# Patient Record
Sex: Female | Born: 1980 | Race: White | Hispanic: No | Marital: Married | Smoking: Never smoker
Health system: Southern US, Community
[De-identification: ages and names within clinical notes are randomized; demographics above are authoritative.]

---

## 2005-08-12 ENCOUNTER — Ambulatory Visit: Payer: Self-pay | Admitting: Otolaryngology

## 2007-06-29 ENCOUNTER — Ambulatory Visit: Payer: Self-pay

## 2007-07-26 ENCOUNTER — Ambulatory Visit: Payer: Self-pay | Admitting: Unknown Physician Specialty

## 2007-08-21 ENCOUNTER — Ambulatory Visit: Payer: Self-pay | Admitting: Unknown Physician Specialty

## 2009-01-07 ENCOUNTER — Ambulatory Visit: Payer: Self-pay | Admitting: Otolaryngology

## 2018-11-09 ENCOUNTER — Emergency Department
Admission: EM | Admit: 2018-11-09 | Discharge: 2018-11-09 | Disposition: A | Discharge status: Other Acute Inpatient Hospital | Payer: 59 | Attending: Emergency Medicine | Admitting: Emergency Medicine

## 2018-11-09 ENCOUNTER — Encounter: Payer: Self-pay | Admitting: Emergency Medicine

## 2018-11-09 ENCOUNTER — Other Ambulatory Visit: Payer: Self-pay

## 2018-11-09 ENCOUNTER — Inpatient Hospital Stay
Admission: AD | Admit: 2018-11-09 | Discharge: 2018-11-30 | DRG: 885 | Disposition: A | Discharge status: Home / Self Care | Payer: 59 | Source: Intra-hospital | Attending: Psychiatry | Admitting: Psychiatry

## 2018-11-09 DIAGNOSIS — J302 Other seasonal allergic rhinitis: Secondary | ICD-10-CM | POA: Diagnosis present

## 2018-11-09 DIAGNOSIS — Z888 Allergy status to other drugs, medicaments and biological substances status: Secondary | ICD-10-CM

## 2018-11-09 DIAGNOSIS — F23 Brief psychotic disorder: Secondary | ICD-10-CM | POA: Diagnosis present

## 2018-11-09 DIAGNOSIS — F411 Generalized anxiety disorder: Secondary | ICD-10-CM | POA: Diagnosis present

## 2018-11-09 DIAGNOSIS — Z791 Long term (current) use of non-steroidal anti-inflammatories (NSAID): Secondary | ICD-10-CM

## 2018-11-09 DIAGNOSIS — F329 Major depressive disorder, single episode, unspecified: Secondary | ICD-10-CM

## 2018-11-09 DIAGNOSIS — F32A Depression, unspecified: Secondary | ICD-10-CM

## 2018-11-09 DIAGNOSIS — F323 Major depressive disorder, single episode, severe with psychotic features: Secondary | ICD-10-CM | POA: Diagnosis not present

## 2018-11-09 DIAGNOSIS — R45851 Suicidal ideations: Secondary | ICD-10-CM

## 2018-11-09 DIAGNOSIS — Z79899 Other long term (current) drug therapy: Secondary | ICD-10-CM

## 2018-11-09 DIAGNOSIS — T43595A Adverse effect of other antipsychotics and neuroleptics, initial encounter: Secondary | ICD-10-CM | POA: Diagnosis not present

## 2018-11-09 DIAGNOSIS — F22 Delusional disorders: Secondary | ICD-10-CM | POA: Diagnosis present

## 2018-11-09 LAB — URINE DRUG SCREEN, QUALITATIVE (ARMC ONLY)
Amphetamines, Ur Screen: NOT DETECTED
Barbiturates, Ur Screen: NOT DETECTED
Benzodiazepine, Ur Scrn: NOT DETECTED
COCAINE METABOLITE, UR ~~LOC~~: NOT DETECTED
Cannabinoid 50 Ng, Ur ~~LOC~~: NOT DETECTED
MDMA (Ecstasy)Ur Screen: NOT DETECTED
Methadone Scn, Ur: NOT DETECTED
OPIATE, UR SCREEN: NOT DETECTED
Phencyclidine (PCP) Ur S: NOT DETECTED
Tricyclic, Ur Screen: NOT DETECTED

## 2018-11-09 LAB — COMPREHENSIVE METABOLIC PANEL
ALT: 21 U/L (ref 0–44)
AST: 21 U/L (ref 15–41)
Albumin: 4.6 g/dL (ref 3.5–5.0)
Alkaline Phosphatase: 64 U/L (ref 38–126)
Anion gap: 12 (ref 5–15)
BUN: 10 mg/dL (ref 6–20)
CO2: 21 mmol/L — ABNORMAL LOW (ref 22–32)
Calcium: 9.3 mg/dL (ref 8.9–10.3)
Chloride: 102 mmol/L (ref 98–111)
Creatinine, Ser: 0.56 mg/dL (ref 0.44–1.00)
GFR calc Af Amer: >60 mL/min (ref >60)
GFR calc non Af Amer: >60 mL/min (ref >60)
Glucose, Bld: 151 mg/dL — ABNORMAL HIGH (ref 70–99)
Potassium: 3.5 mmol/L (ref 3.5–5.1)
Sodium: 135 mmol/L (ref 135–145)
Total Bilirubin: 0.6 mg/dL (ref 0.3–1.2)
Total Protein: 9 g/dL — ABNORMAL HIGH (ref 6.5–8.1)

## 2018-11-09 LAB — CBC
HEMATOCRIT: 43.3 % (ref 36.0–46.0)
Hemoglobin: 14.1 g/dL (ref 12.0–15.0)
MCH: 27.8 pg (ref 26.0–34.0)
MCHC: 32.6 g/dL (ref 30.0–36.0)
MCV: 85.2 fL (ref 80.0–100.0)
Platelets: 292 10*3/uL (ref 150–400)
RBC: 5.08 MIL/uL (ref 3.87–5.11)
RDW: 13.5 % (ref 11.5–15.5)
WBC: 8 10*3/uL (ref 4.0–10.5)
nRBC: 0 % (ref 0.0–0.2)

## 2018-11-09 LAB — SALICYLATE LEVEL: Salicylate Lvl: <7 mg/dL (ref 2.8–30.0)

## 2018-11-09 LAB — ACETAMINOPHEN LEVEL: Acetaminophen (Tylenol), Serum: <10 ug/mL — ABNORMAL LOW (ref 10–30)

## 2018-11-09 LAB — POCT PREGNANCY, URINE: Preg Test, Ur: NEGATIVE

## 2018-11-09 LAB — ETHANOL: Alcohol, Ethyl (B): <10 mg/dL (ref <10)

## 2018-11-09 MED ORDER — ALUM & MAG HYDROXIDE-SIMETH 200-200-20 MG/5ML PO SUSP: 30.0000 mL | ORAL | Status: DC | PRN

## 2018-11-09 MED ORDER — BREAST MILK/FORMULA (FOR LABEL PRINTING ONLY): ORAL | Status: DC

## 2018-11-09 MED ORDER — QUETIAPINE FUMARATE 100 MG PO TABS
100.0000 mg | ORAL_TABLET | Freq: Once | ORAL | Status: AC
  Administered 2018-11-09: 100 mg via ORAL
  Filled 2018-11-09: qty 1

## 2018-11-09 MED ORDER — MAGNESIUM HYDROXIDE 400 MG/5ML PO SUSP: 30.0000 mL | Freq: Every day | ORAL | Status: DC | PRN

## 2018-11-09 MED ORDER — LORATADINE 10 MG PO TABS
10.0000 mg | ORAL_TABLET | Freq: Every day | ORAL | Status: DC
  Administered 2018-11-09 – 2018-11-30 (×21): 10 mg via ORAL
  Filled 2018-11-09 (×23): qty 1

## 2018-11-09 MED ORDER — DILTIAZEM HCL 60 MG PO TABS
60.0000 mg | ORAL_TABLET | Freq: Two times a day (BID) | ORAL | Status: DC
  Administered 2018-11-09 – 2018-11-13 (×8): 60 mg via ORAL
  Filled 2018-11-09 (×8): qty 1

## 2018-11-09 MED ORDER — TRAZODONE HCL 50 MG PO TABS
50.0000 mg | ORAL_TABLET | Freq: Every day | ORAL | Status: DC
  Administered 2018-11-09 – 2018-11-29 (×20): 50 mg via ORAL
  Filled 2018-11-09 (×22): qty 1

## 2018-11-09 MED ORDER — HYDROXYZINE HCL 25 MG PO TABS
25.0000 mg | ORAL_TABLET | Freq: Four times a day (QID) | ORAL | Status: DC | PRN
  Administered 2018-11-09 – 2018-11-28 (×10): 25 mg via ORAL
  Filled 2018-11-09 (×10): qty 1

## 2018-11-09 MED ORDER — LORAZEPAM 2 MG PO TABS
2.0000 mg | ORAL_TABLET | ORAL | Status: DC | PRN
  Administered 2018-11-09 – 2018-11-17 (×13): 2 mg via ORAL
  Filled 2018-11-09 (×14): qty 1

## 2018-11-09 MED ORDER — SERTRALINE HCL 25 MG PO TABS
25.0000 mg | ORAL_TABLET | Freq: Every day | ORAL | Status: DC
  Administered 2018-11-09 – 2018-11-15 (×7): 25 mg via ORAL
  Filled 2018-11-09 (×7): qty 1

## 2018-11-09 MED ORDER — IBUPROFEN 200 MG PO TABS
400.0000 mg | ORAL_TABLET | Freq: Four times a day (QID) | ORAL | Status: DC | PRN
  Administered 2018-11-18: 400 mg via ORAL
  Filled 2018-11-09 (×2): qty 2

## 2018-11-09 MED ORDER — LORAZEPAM 2 MG PO TABS
2.0000 mg | ORAL_TABLET | Freq: Once | ORAL | Status: AC
  Administered 2018-11-09: 2 mg via ORAL
  Filled 2018-11-09: qty 1

## 2018-11-09 MED ORDER — ACETAMINOPHEN 325 MG PO TABS
650.0000 mg | ORAL_TABLET | Freq: Four times a day (QID) | ORAL | Status: DC | PRN
  Administered 2018-11-17 – 2018-11-29 (×3): 650 mg via ORAL
  Filled 2018-11-09 (×3): qty 2

## 2018-11-09 NOTE — ED Notes (Signed)
Patient visiting with her husband at this time.

## 2018-11-09 NOTE — ED Notes (Signed)
Pt having moments of confusion, asking if it's really march 19th, thinks it's march 17th. Repeating "I need sleep and I know I just need to stay calm."

## 2018-11-09 NOTE — ED Notes (Signed)
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

## 2018-11-09 NOTE — ED Notes (Signed)
Patient ran out of her room and yelled the rhapsody is coming, I need to see my husband and children now.

## 2018-11-09 NOTE — ED Notes (Signed)
Report to include Situation, Background, Assessment, and Recommendations received from [facility]. Patient alert and oriented, warm and dry, in no acute distress. Patient denied SI, HI, AVH and pain. Patient said she thought someone is going to kill her. She is under a lot of stress due to work. Patient is a Engineer, civil (consulting). Patient made aware of Q15 minute rounds and Psychologist, counselling presence for their safety. Patient instructed to come to me with needs or concerns.

## 2018-11-09 NOTE — ED Notes (Signed)
Patients husband Makesha Romanchuk called to inform us that patient does take essential oils but he is unsure of the combination of oils she uses and just wanted to see if the oils are playing a part in whats going on

## 2018-11-09 NOTE — ED Notes (Signed)
Patient is transferred to [facility] via NT and officer. Patient is stabel in NAD at the time of transfer. Patient belongings transferred with patient. Report given to [facility] RN. No issues

## 2018-11-09 NOTE — BH Assessment (Addendum)
Assessment Note  Ashley Griffin is an 38 y.o. female who presents to ED under IVC after her husband came home and found her acting bizarre. Pt reports "I'm afraid to go to sleep - I haven't slept since Monday. My husband called 911 because he thought I was going crazy and I thought I was going to hell (pt states she was told this by satan)." When this writer assessed for SI, pt had some thought-blocking behaviors and eventually she responded "no" when asked if she had been having thoughts to end her life. Pt also denied past thoughts and/or plan to end her life. However, when this writer asked pt's husband of past/current SI gestures and/or statements he reported "she told me she thought I'd be better off raising the kids by myself". Pt denied HI. Pt was extremely tearful while hyper-religious throughout assessment process. Pt was fearful of falling asleep, as she repeatedly told this writer "please don't let me fall asleep ... I can't fall asleep". Additional information was gathered from pt's husband due to pt's confusion and disorganized thought patterns.  Per pt's husband, pt is employed with [employer] as a Designer, jewellery. 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 He reports he believes this triggered pt's increased stress level. He also reports she is currently nursing her youngest child (61 months old) while also parenting an 73-year-old and 34-year-old. He further reports pt has not slept since Thursday (11/02/2018) and has had a decrease in her appetite. He was unable to recall pt exhibiting any manic behaviors (examples were given to husband) other than her inability to sleep. Yesterday (11/08/2018), he reports pt tried to fight him "because she was suspicious of who I was talking to". He also told this writer one night (this week) pt "woke up in the middle of the night and  she told me she saw people outside of our window walking in fire because they were going to hell".  Pt was recently seen by her primary care provider [facility]) last week where she was prescribed Zoloft and another medication the husband was unable to recall. However, pt never went to pick up Zoloft prescription per husband report. Pt does not have any history of past inpatient hospitalizations and/or outpatient mental health treatment.  Diagnosis: Major Depressive Disorder with psychotic features  Past Medical History: History reviewed. No pertinent past medical history.  History reviewed. No pertinent surgical history.  Family History: History reviewed. No pertinent family history.  Social History:  reports that she has never smoked. She has never used smokeless tobacco. She reports previous alcohol use. No history on file for drug.  Additional Social History:  Alcohol / Drug Use Pain Medications: See MAR Prescriptions: See MAR Over the Counter: See MAR History of alcohol / drug use?: No history of alcohol / drug abuse Longest period of sobriety (when/how long): (N/A) Negative Consequences of Use: (N/A) Withdrawal Symptoms: (N/A)  CIWA: CIWA-Ar BP: (!) 148/93 COWS:    Allergies: Not on File  Home Medications: (Not in a hospital admission)   OB/GYN Status:  Patient's last menstrual period was 11/09/2018 (exact date).  General Assessment Data Location of Assessment: [facility] ED TTS Assessment: In system Is this a Tele or Face-to-Face Assessment?: Face-to-Face Is this an Initial Assessment or a Re-assessment for this encounter?: Initial Assessment Patient Accompanied by:: N/A Language Other than English: No  Living Arrangements: Other (Comment)(Private Residence) What gender do you identify as?: Female Marital status: Married(16 years) [name] name: [facility] Pregnancy Status: No(Pt is currently nursing her youngest child) Living Arrangements: Spouse/significant other,  Children(3 children) Can pt return to current living arrangement?: Yes Admission Status: Involuntary Petitioner: Police Is patient capable of signing voluntary admission?: No Referral Source: Self/Family/Friend Insurance type: Scientist, water quality not listed; pt is employed - may be listed on [employer])  Medical Screening Exam ([facility] Walk-in ONLY) Medical Exam completed: Yes  Crisis Care Plan Living Arrangements: Spouse/significant other, Children(3 children) Legal Guardian: Other:(Self) Name of Psychiatrist: N/A Name of Therapist: N/A  Education Status Is patient currently in school?: No Is the patient employed, unemployed or receiving disability?: 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 : No Suicidal Intent: No Has patient had any suicidal intent within the past 6 months prior to admission? : No Is patient at risk for suicide?: No Suicidal Plan?: No Has patient had any suicidal plan within the past 6 months prior to admission? : No Access to Means: No What has been your use of drugs/alcohol within the last 12 months?: None Reported Previous Attempts/Gestures: No How many times?: 0 Other Self Harm Risks: None Reported Triggers for Past Attempts: None known Intentional Self Injurious Behavior: None Family Suicide History: No Recent stressful life event(s): Turmoil (Comment), Other (Comment)(Fear of COVID-19; increased workload; additional child home) Persecutory voices/beliefs?: Yes Depression: Yes Depression Symptoms: Tearfulness, Insomnia, Guilt Substance abuse history and/or treatment for substance abuse?: No Suicide prevention information given to non-admitted patients: Not applicable  Risk to Others within the past 6 months Homicidal Ideation: No Does patient have any lifetime risk of violence toward others beyond the six months prior  to admission? : No Thoughts of Harm to Others: No Current Homicidal Intent: No Current Homicidal Plan: No Access to Homicidal Means: No Identified Victim: N/A History of harm to others?: No Assessment of Violence: None Noted Violent Behavior Description: None Reported Does patient have access to weapons?: No Criminal Charges Pending?: No Does patient have a court date: No Is patient on probation?: No  Psychosis Hallucinations: Visual Delusions: Persecutory(Hyper-religious)  Mental Status Report Appearance/Hygiene: In scrubs Eye Contact: Fair Motor Activity: Freedom of movement Speech: Unable to assess(Pt was tearful making it difficult to understand) Level of Consciousness: Crying Mood: Fearful, Sad Affect: Frightened, Fearful, Labile Anxiety Level: Moderate Thought Processes: Thought Blocking Judgement: Unable to Assess Orientation: Person, Place Obsessive Compulsive Thoughts/Behaviors: None  Cognitive Functioning Concentration: Poor Memory: Unable to Assess Is patient IDD: No Insight: Poor Impulse Control: Poor Appetite: Poor Have you had any weight changes? : No Change Sleep: Decreased Total Hours of Sleep: 0 Vegetative Symptoms: Unable to Assess  ADLScreening [facility] Assessment Services) Patient's cognitive ability adequate to safely complete daily activities?: Yes Patient able to express need for assistance with ADLs?: Yes Independently performs ADLs?: Yes (appropriate for developmental age)  Prior Inpatient Therapy Prior Inpatient Therapy: No  Prior Outpatient Therapy Prior Outpatient Therapy: No Does patient have an ACCT team?: No Does patient have Intensive In-House Services?  : No Does patient have [facility] services? : No Does patient have P4CC services?: No  ADL Screening (condition at time of admission) Patient's cognitive ability adequate to safely complete daily activities?: Yes Patient able to express need for assistance with ADLs?:  Yes Independently performs ADLs?: Yes (appropriate for developmental  age)       Abuse/Neglect Assessment (Assessment to be complete while patient is alone) Abuse/Neglect Assessment Can Be Completed: Yes Physical Abuse: Denies Verbal Abuse: Denies Sexual Abuse: Denies Exploitation of patient/patient's resources: Denies Self-Neglect: Denies Values / Beliefs Cultural Requests During Hospitalization: None Spiritual Requests During Hospitalization: None Consults Spiritual Care Consult Needed: No Social Work Consult Needed: No Merchant navy officer (For Healthcare) Does Patient Have a Medical Advance Directive?: No       Child/Adolescent Assessment Running Away Risk: (Patient is an adult)  Disposition:  Disposition Initial Assessment Completed for this Encounter: Yes Disposition of Patient: Admit Type of inpatient treatment program: Adult Patient refused recommended treatment: No Mode of transportation if patient is discharged/movement?: N/A Patient referred to: Other (Comment)([facility])  On Site Evaluation by:   Reviewed with Physician:    Wilmon Arms 11/09/2018 3:23 PM

## 2018-11-09 NOTE — Tx Team (Signed)
Initial Treatment Plan 11/09/2018 9:06 PM Ashley Griffin BLT:[id number]    PATIENT STRESSORS: Financial difficulties Marital or family conflict Medication change or noncompliance Occupational concerns   PATIENT STRENGTHS: Average or above average intelligence Communication skills Motivation for treatment/growth Religious Affiliation   PATIENT IDENTIFIED PROBLEMS: Suicide Ideations    Severe Anxiety    Depression             DISCHARGE CRITERIA:  Ability to meet basic life and health needs Improved stabilization in mood, thinking, and/or behavior Motivation to continue treatment in a less acute level of care Safe-care adequate arrangements made  PRELIMINARY DISCHARGE PLAN: Attend PHP/IOP Outpatient therapy Participate in family therapy Return to previous living arrangement  PATIENT/FAMILY INVOLVEMENT: This treatment plan has been presented to and reviewed with the patient, Ashley Griffin,   The patient  have been given the opportunity to ask questions and make suggestions.  Lelan Pons, RN 11/09/2018, 9:06 PM

## 2018-11-09 NOTE — ED Provider Notes (Signed)
[facility] Emergency Department Provider Note   ____________________________________________   First MD Initiated Contact with Patient 11/09/18 1218     (approximate)  I have reviewed the triage vital signs and the nursing notes.   HISTORY  Chief Complaint Paranoid    HPI Ashley Griffin is a 38 y.o. female patient apparently had an episode of feeling suicidal and thinking that her children would be better off without her.  She has also had episodes where she feels confused and is not sure that things are real.  She told me that she still having some of these episodes now when I saw her.  She is not having any numbness or weakness.  She does appear to be quite anxious.         History reviewed. No pertinent past medical history.  Patient Active Problem List   Diagnosis Date Noted  . 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 No pertinent surgical history.  Prior to Admission medications   Medication Sig Start Date End Date Taking? Authorizing Provider  diltiazem (CARDIZEM) 60 MG tablet Take 60 mg by mouth every 12 (twelve) hours. 05/10/17  Yes [provider]  fexofenadine (ALLEGRA) 180 MG tablet Take 180 mg by mouth as needed. 10/21/10  Yes [provider]  hydrOXYzine (ATARAX/VISTARIL) 25 MG tablet Take 25 mg by mouth every 6 (six) hours as needed for sleep. 11/06/18  Yes [provider]  ibuprofen (ADVIL,MOTRIN) 200 MG tablet Take 600 mg by mouth every 6 (six) hours as needed for pain. 05/04/17  Yes [provider]  sertraline (ZOLOFT) 50 MG tablet Take 25 mg by mouth daily. 11/08/18 11/08/19 Yes [provider]  traZODone (DESYREL) 50 MG tablet Take 50-100 mg by mouth at bedtime. 11/08/18 12/08/18 Yes [provider]    Allergies Patient has no allergy information on record.  History reviewed. No pertinent family history.  Social History Social History   Tobacco Use  .  Smoking status: Never Smoker  . Smokeless tobacco: Never Used  Substance Use Topics  . Alcohol use: Not Currently    Frequency: Never  . Drug use: Not on file    Review of Systems  Constitutional: No fever/chills Eyes: No visual changes. ENT: No sore throat. Cardiovascular: Denies chest pain. Respiratory: Denies shortness of breath. Gastrointestinal: No abdominal pain.  No nausea, no vomiting.  No diarrhea.  No constipation. Genitourinary: Negative for dysuria. Musculoskeletal: Negative for back pain. Skin: Negative for rash. Neurological: Negative for headaches, focal weakness   ____________________________________________   PHYSICAL EXAM:  VITAL SIGNS: ED Triage Vitals  Enc Vitals Group     BP 11/09/18 1125 (!) 148/93     Pulse --      Resp 11/09/18 1125 16     Temp 11/09/18 1125 98.3 F (36.8 C)     Temp Source 11/09/18 1125 Oral     SpO2 11/09/18 1125 100 %     Weight 11/09/18 1128 150 lb (68 kg)     Height 11/09/18 1128 5\' 3"  (1.6 m)     Head Circumference --      Peak Flow --      Pain Score --      Pain Loc --      Pain Edu? --      Excl. in GC? --     Constitutional: Alert and oriented. Well appearing but anxious Eyes: Conjunctivae are normal.  Head: Atraumatic Nose: No congestion/rhinnorhea. Mouth/Throat: Mucous membranes  are moist.  Oropharynx non-erythematous. Neck: No stridor.  Cardiovascular: Normal rate, regular rhythm. Grossly normal heart sounds.  Good peripheral circulation. Respiratory: Normal respiratory effort.  No retractions. Lungs CTAB. Gastrointestinal: Soft and nontender. No distention. No abdominal bruits.  Musculoskeletal: No lower extremity tenderness nor edema.  Neurologic:  Normal sp Skin:  Skin is warm, dry and intact. No rash noted. . ____________________________________________   LABS (all labs ordered are listed, but only abnormal results are displayed)  Labs Reviewed  COMPREHENSIVE METABOLIC PANEL - Abnormal; Notable  for the following components:      Result Value   CO2 21 (*)    Glucose, Bld 151 (*)    Total Protein 9.0 (*)    All other components within normal limits  ACETAMINOPHEN LEVEL - Abnormal; Notable for the following components:   Acetaminophen (Tylenol), Serum <10 (*)    All other components within normal limits  ETHANOL  SALICYLATE LEVEL  CBC  URINE DRUG SCREEN, QUALITATIVE ([facility] ONLY)  POC URINE PREG, ED  POCT PREGNANCY, URINE   ____________________________________________  EKG   ____________________________________________  RADIOLOGY  ED MD interpretation:    Official radiology report(s): No results found.  ____________________________________________   PROCEDURES  Procedure(s) performed (including Critical Care):  Procedures   ____________________________________________   INITIAL IMPRESSION / ASSESSMENT AND PLAN / ED COURSE  Patient seen by psychiatry NP.  She recommends inpatient treatment I fully agree.  This patient has several psych problems going on.  I will diagnose her with suicidal ideation for now.              ____________________________________________   FINAL CLINICAL IMPRESSION(S) / ED DIAGNOSES  Final diagnoses:  Suicidal ideation     ED Discharge Orders    None       Note:  This document was prepared using Dragon voice recognition software and may include unintentional dictation errors.    Arnaldo Natal, MD 11/09/18 [phone]

## 2018-11-09 NOTE — Consult Note (Signed)
[facility] Face-to-Face Psychiatry Consult   Reason for Consult:  Psychosis Referring Physician:  Dr. Darnelle Catalan Patient Identification: Ashley Griffin MRN:  [id number] Principal Diagnosis: <principal problem not specified> Diagnosis:  Major Depression with psychotic features  Total Time spent with patient: 1 hour  Subjective:   Ashley Griffin is a 38 y.o. female patient presented to [facility] ED via law enforcement under involuntary commitment status (IVC) . The patient was seen face-to-face by this provider; chart reviewed and consulted with Dr.Melinda on 11/09/2018 due to the care of the patient. It was discussed that the patient does meet criteria to be admitted to the inpatient unit. Due to the patient presenting with psychotic symptoms along with auditory and visual hallucinations.  Stated to her husband "you will be better off raising the children by yourself." She was given 2 mg of Ativan by mouth.  She has been resting quietly and does not appear to be responding to internal or external stimuli currently.  Prior to accepting the Ativan she was presenting with delusional thinking. It was reported by her husband that she was presenting suicidal, homicidal, and self-harm ideations. The patient is not presenting with any psychotic or paranoid behaviors at this monent. During an encounter with the patient, she was able to answer some questions appropriately.  Collateral was obtained by the patient's husband who expresses concerns for the patient psychotic behaviors. " I have never seen her like this."  Per TTS Counselor Ms. Elisabeth Most, Per pt's husband, pt is employed with [employer] as a Designer, jewellery. 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 He reports he believes this triggered pt's increased stress level. He also reports she is currently nursing her youngest child (46 months  old) while also parenting an 85-year-old and 83-year-old. He further reports pt has not slept since Thursday (11/02/2018) and has had a decrease in her appetite. He was unable to recall pt exhibiting any manic behaviors (examples were given to husband) other than her inability to sleep. Yesterday (11/08/2018), he reports pt tried to fight him "because she was suspicious of who I was talking to". He also told this writer one night (this week) pt "woke up in the middle of the night and she told me she saw people outside of our window walking in fire because they were going to hell". Pt was recently seen by her primary care provider [facility]) last week where she was prescribed Zoloft and another medication the husband was unable to recall. However, pt never went to pick up Zoloft prescription per husband report. Pt does not have any history of past inpatient hospitalizations and/or outpatient mental health treatment. Per patient husband she has never had any psychiatric hospitalization. He did disclosed that she suffered from postpartum depression, but never sought treatment for it. "She will never take any type of medication. She believes in all natural stuff." Plan: The patient is a safety risk to herself and her children, She requires inpatient stabilization and treatment.  HPI: Per Dr. Juliette Alcide; HPI Ashley Griffin is a 38 y.o. female patient apparently had an episode of feeling suicidal and thinking that her children would be better off without her.  She has also had episodes where she feels confused and is not sure that things are real.  She told me that she still having some of these episodes now when I saw her.  She is not having any numbness or weakness.  She does appear to be quite anxious.      Past Psychiatric History:  Postpartum depression  Risk to Self: Suicidal Ideation: No Suicidal Intent: No Is patient at risk for suicide?: No Suicidal Plan?: No Access to Means: No What has been  your use of drugs/alcohol within the last 12 months?: None Reported How many times?: (P) 0 Other Self Harm Risks: (P) None Reported Triggers for Past Attempts: (P) None known Intentional Self Injurious Behavior: (P) None Risk to Others: Homicidal Ideation: (P) No Thoughts of Harm to Others: (P) No Current Homicidal Intent: (P) No Current Homicidal Plan: (P) No Access to Homicidal Means: (P) No Identified Victim: (P) N/A History of harm to others?: (P) No Assessment of Violence: (P) None Noted Violent Behavior Description: (P) None Reported Does patient have access to weapons?: (P) No Criminal Charges Pending?: (P) No Does patient have a court date: (P) No Prior Inpatient Therapy:  No Prior Outpatient Therapy:  No  Past Medical History: History reviewed. No pertinent past medical history. History reviewed. No pertinent surgical history. Family History: History reviewed. No pertinent family history. Family Psychiatric  History: History reviewed. No pertinent family history Social History:  History reviewed. No pertinent family history Social History   Substance and Sexual Activity  Alcohol Use Not Currently  . Frequency: Never     Social History   Substance and Sexual Activity  Drug Use Not on file    Social History   Socioeconomic History  . Marital status: Married    Spouse name: Not on file  . Number of children: Not on file  . Years of education: Not on file  . Highest education level: Not on file  Occupational History  . Not on file  Social Needs  . Financial resource strain: Not on file  . Food insecurity:    Worry: Not on file    Inability: Not on file  . Transportation needs:    Medical: Not on file    Non-medical: Not on file  Tobacco Use  . Smoking status: Never Smoker  . Smokeless tobacco: Never Used  Substance and Sexual Activity  . Alcohol use: Not Currently    Frequency: Never  . Drug use: Not on file  . Sexual activity: Not on file  Lifestyle   . Physical activity:    Days per week: Not on file    Minutes per session: Not on file  . Stress: Not on file  Relationships  . Social connections:    Talks on phone: Not on file    Gets together: Not on file    Attends religious service: Not on file    Active member of club or organization: Not on file    Attends meetings of clubs or organizations: Not on file    Relationship status: Not on file  Other Topics Concern  . Not on file  Social History Narrative  . Not on file   Additional Social History:    Allergies:  Not on File  Labs:  Results for orders placed or performed during the hospital encounter of 11/09/18 (from the past 48 hour(s))  Comprehensive metabolic panel     Status: Abnormal   Collection Time: 11/09/18 11:41 AM  Result Value Ref Range   Sodium 135 135 - 145 mmol/L   Potassium 3.5 3.5 - 5.1 mmol/L   Chloride 102 98 - 111 mmol/L   CO2 21 (L) 22 - 32  mmol/L   Glucose, Bld 151 (H) 70 - 99 mg/dL   BUN 10 6 - 20 mg/dL   Creatinine, Ser 1.610.56 0.44 - 1.00 mg/dL   Calcium 9.3 8.9 - 09.610.3 mg/dL   Total Protein 9.0 (H) 6.5 - 8.1 g/dL   Albumin 4.6 3.5 - 5.0 g/dL   AST 21 15 - 41 U/L   ALT 21 0 - 44 U/L   Alkaline Phosphatase 64 38 - 126 U/L   Total Bilirubin 0.6 0.3 - 1.2 mg/dL   GFR calc non Af Amer >60 >60 mL/min   GFR calc Af Amer >60 >60 mL/min   Anion gap 12 5 - 15    Comment: Performed at [facility], [address] [place], [place] [address]  Ethanol     Status: None   Collection Time: 11/09/18 11:41 AM  Result Value Ref Range   Alcohol, Ethyl (B) <10 <10 mg/dL    Comment: (NOTE) Lowest detectable limit for serum alcohol is 10 mg/dL. For medical purposes only. Performed at [facility], [address] [place], [place] [address]   Salicylate level     Status: None   Collection Time: 11/09/18 11:41 AM  Result Value Ref Range   Salicylate Lvl <7.0 2.8 - 30.0 mg/dL    Comment: Performed at [facility], [address] [place], [place] [address]  Acetaminophen level     Status: Abnormal   Collection Time: 11/09/18 11:41 AM  Result Value Ref Range   Acetaminophen (Tylenol), Serum <10 (L) 10 - 30 ug/mL    Comment: (NOTE) Therapeutic concentrations vary significantly. A range of 10-30 ug/mL  may be an effective concentration for many patients. However, some  are best treated at concentrations outside of this range. Acetaminophen concentrations >150 ug/mL at 4 hours after ingestion  and >50 ug/mL at 12 hours after ingestion are often associated with  toxic reactions. Performed at [facility], [address] [place], [place] [address]   cbc     Status: None   Collection Time: 11/09/18 11:41 AM  Result Value Ref Range   WBC 8.0 4.0 - 10.5 K/uL   RBC 5.08 3.87 - 5.11 MIL/uL   Hemoglobin 14.1 12.0 - 15.0 g/dL   HCT 30.843.3 65.736.0 - 84.646.0 %   MCV 85.2 80.0 - 100.0 fL   MCH 27.8 26.0 - 34.0 pg   MCHC 32.6 30.0 - 36.0 g/dL   RDW 96.213.5 95.211.5 - 84.115.5 %   Platelets 292 150 - 400 K/uL   nRBC 0.0 0.0 - 0.2 %    Comment: Performed at [facility], [address] [place], [place] [address]  Urine Drug Screen, Qualitative     Status: None   Collection Time: 11/09/18  1:18 PM  Result Value Ref Range   Tricyclic, Ur Screen NONE DETECTED NONE DETECTED   Amphetamines, Ur Screen NONE DETECTED NONE DETECTED   MDMA (Ecstasy)Ur Screen NONE DETECTED NONE DETECTED   Cocaine Metabolite,Ur [place] NONE DETECTED NONE DETECTED   Opiate, Ur Screen NONE DETECTED NONE DETECTED   Phencyclidine (PCP) Ur S NONE DETECTED NONE DETECTED   Cannabinoid 50 Ng, Ur [place] NONE DETECTED NONE DETECTED   Barbiturates, Ur Screen NONE DETECTED NONE DETECTED   Benzodiazepine, Ur Scrn NONE DETECTED NONE DETECTED   Methadone Scn, Ur NONE DETECTED NONE DETECTED    Comment: (NOTE) Tricyclics + metabolites, urine    Cutoff 1000 ng/mL Amphetamines + metabolites, urine  Cutoff 1000 ng/mL MDMA (Ecstasy),  urine              Cutoff 500  ng/mL Cocaine Metabolite, urine          Cutoff 300 ng/mL Opiate + metabolites, urine        Cutoff 300 ng/mL Phencyclidine (PCP), urine         Cutoff 25 ng/mL Cannabinoid, urine                 Cutoff 50 ng/mL Barbiturates + metabolites, urine  Cutoff 200 ng/mL Benzodiazepine, urine              Cutoff 200 ng/mL Methadone, urine                   Cutoff 300 ng/mL The urine drug screen provides only a preliminary, unconfirmed analytical test result and should not be used for non-medical purposes. Clinical consideration and professional judgment should be applied to any positive drug screen result due to possible interfering substances. A more specific alternate chemical method must be used in order to obtain a confirmed analytical result. Gas chromatography / mass spectrometry (GC/MS) is the preferred confirmat ory method. Performed at [facility], [address] [place], [place] [address]   Pregnancy, urine POC     Status: None   Collection Time: 11/09/18  1:31 PM  Result Value Ref Range   Preg Test, Ur NEGATIVE NEGATIVE    Comment:        THE SENSITIVITY OF THIS METHODOLOGY IS >24 mIU/mL     No current facility-administered medications for this encounter.    Current Outpatient Medications  Medication Sig Dispense Refill  . diltiazem (CARDIZEM) 60 MG tablet Take 60 mg by mouth every 12 (twelve) hours.    . fexofenadine (ALLEGRA) 180 MG tablet Take 180 mg by mouth as needed.    . hydrOXYzine (ATARAX/VISTARIL) 25 MG tablet Take 25 mg by mouth every 6 (six) hours as needed for sleep.    Marland Kitchen ibuprofen (ADVIL,MOTRIN) 200 MG tablet Take 600 mg by mouth every 6 (six) hours as needed for pain.    Marland Kitchen sertraline (ZOLOFT) 50 MG tablet Take 25 mg by mouth daily.    . traZODone (DESYREL) 50 MG tablet Take 50-100 mg by mouth at bedtime.      Musculoskeletal: Strength & Muscle Tone: within normal limits Gait & Station: normal Patient leans: N/A  Psychiatric Specialty  Exam: Physical Exam  Nursing note and vitals reviewed. Constitutional: She appears well-developed and well-nourished.  HENT:  Head: Normocephalic and atraumatic.  Right Ear: External ear normal.  Left Ear: External ear normal.  Eyes: Pupils are equal, round, and reactive to light. Conjunctivae and EOM are normal.  Neck: Normal range of motion. Neck supple.  Cardiovascular: Normal rate and regular rhythm.  Respiratory: Breath sounds normal.  Musculoskeletal: Normal range of motion.  Neurological: She is alert.  Skin: Skin is warm and dry.    Review of Systems  Constitutional: Negative.   HENT: Negative.   Eyes: Negative.   Respiratory: Negative.   Cardiovascular: Negative.   Gastrointestinal: Negative.   Genitourinary: Negative.   Musculoskeletal: Negative.   Neurological: Negative.   Endo/Heme/Allergies: Negative.   Psychiatric/Behavioral: Positive for depression, hallucinations and suicidal ideas. Negative for memory loss and substance abuse. The patient is nervous/anxious and has insomnia.     Blood pressure (!) 148/93, temperature 98.3 F (36.8 C), temperature source Oral, resp. rate 16, height 5\' 3"  (1.6 m), weight 68 kg, last menstrual period  11/09/2018, SpO2 100 %.Body mass index is 26.57 kg/m.  General Appearance: Fairly Groomed  Eye Contact:  Minimal  Speech:  Pressured  Volume:  Increased  Mood:  Anxious, Depressed and Hopeless  Affect:  Depressed, Flat and Tearful  Thought Process:  Disorganized  Orientation:  Full (Time, Place, and Person)  Thought Content:  Delusions, Hallucinations: Auditory Visual and Ideas of Reference:   Paranoia  Suicidal Thoughts:  No  Homicidal Thoughts:  No  Memory:  Recent;   Good  Judgement:  Poor  Insight:  Lacking  Psychomotor Activity:  Normal  Concentration:  Concentration: Poor  Recall:  Fair  Fund of Knowledge:  Fair  Language:  Good  Akathisia:  Negative  Handed:  Right  AIMS (if indicated):     Assets:  Desire for  Improvement Social Support  ADL's:  Intact  Cognition:  Impaired,  Moderate  Sleep:   Insomnia     Treatment Plan Summary: Daily contact with patient to assess and evaluate symptoms and progress in treatment and Medication management  Disposition: Supportive therapy provided about ongoing stressors.  Patient does meet criteria for psychiatric inpatient admission  Catalina Gravel, NP 11/09/2018 2:45 PM

## 2018-11-09 NOTE — ED Notes (Signed)
Black flip flops, Pink shirt, black bra, black pants, gray underwear placed in patient belongings bag.

## 2018-11-09 NOTE — ED Notes (Signed)
Patient visiting with husband, patient is tearful and still continues to be confused asking what time is it and date, constantly. Patient continues to be drowsy from the Ativan, Clinical research associate talked with patient and husband and explained to them that patient will be admitted inpatient, patient and husband became tearful.   Patients husband discussed with Clinical research associate that she is on her menstrual cycle, Clinical research associate placed sanitary napkins in patients room and explained what they were.

## 2018-11-09 NOTE — ED Notes (Signed)
Patient visiting with husband. 

## 2018-11-09 NOTE — ED Triage Notes (Signed)
Pt brought in by [place] Sheriff dept. Husband came home from work, because she "needed him to come home right away," husband had to restrain her once he got there-pt was stating she wanted to kill herself. Husband called police, IVC papers present. Pt is a Engineer, civil (consulting), has been overwhelmed at work and at home. States "I want to go to sleep and never wake up." Calm and appropriate in triage.

## 2018-11-09 NOTE — Progress Notes (Signed)
Patient is a new admit from ED IVC-ed  By her husband for feeling suicidal and thinking that her children will do better with out her, patient states that she is confused , depressed with severe anxiety due to COVID19, hyper religious that GOD is going to kill every body. Patient is tearful and hopeless.lack concentration, mood swings and sadness  Refused to contract for safety stating that she can't think straight . Unit safety guidelines  and procedure is explained to patient , cold sandwich and beverages are provided. Hygiene products are  provided , patient is a lactating mother. Skin and body search is by two nurses , no contraband found and skin is clean, room orientation is complete, room is within  eyesight from the nurses station, patient states I"m not feeling suicidal but I need medicine for anxiety, medication is given per dr. Jarrett Ables. See MAR.

## 2018-11-09 NOTE — ED Notes (Signed)
Patient assigned to appropriate care area   Introduced self to pt  Patient oriented to unit/care area: Informed that, for their safety, care areas are designed for safety and visiting and phone hours explained to patient. Patient verbalizes understanding, and verbal contract for safety obtained  Environment secured    Patient very tearful and stating she feels very confused, she says I know where I am, but I am just making sure all of this is real, me being here at this hospital.

## 2018-11-09 NOTE — BH Assessment (Signed)
Patient is to be admitted to [facility] by Gillermo Murdoch, NP.  Attending Physician will be Dr. Toni Amend.   Patient has been assigned to room 324, by [facility] Charge Nurse S[place].   Intake Paper Work has been signed and placed on patient chart.  ER staff is aware of the admission:  Misty Stanley, ER Secretary    Dr. Darnelle Catalan, ER MD   Geralynn Ochs, Patient's Nurse   Dedra Skeens, Patient Access.

## 2018-11-09 NOTE — ED Triage Notes (Signed)
Denies si/hi at this time, crying off and on and stating "I just want to be a good mom and wife, am I doing the right thing?" Easily calms.

## 2018-11-10 DIAGNOSIS — F32A Depression, unspecified: Secondary | ICD-10-CM

## 2018-11-10 DIAGNOSIS — F411 Generalized anxiety disorder: Secondary | ICD-10-CM

## 2018-11-10 DIAGNOSIS — F23 Brief psychotic disorder: Secondary | ICD-10-CM

## 2018-11-10 DIAGNOSIS — F329 Major depressive disorder, single episode, unspecified: Secondary | ICD-10-CM

## 2018-11-10 MED ORDER — ENSURE ENLIVE PO LIQD
237.0000 mL | Freq: Two times a day (BID) | ORAL | Status: DC
  Administered 2018-11-10 – 2018-11-30 (×38): 237 mL via ORAL

## 2018-11-10 MED ORDER — ADULT MULTIVITAMIN W/MINERALS CH
1.0000 | ORAL_TABLET | Freq: Every day | ORAL | Status: DC
  Administered 2018-11-10 – 2018-11-30 (×21): 1 via ORAL
  Filled 2018-11-10 (×21): qty 1

## 2018-11-10 MED ORDER — QUETIAPINE FUMARATE 100 MG PO TABS: 100.0000 mg | ORAL_TABLET | Freq: Every evening | ORAL | Status: DC | PRN

## 2018-11-10 NOTE — Progress Notes (Signed)
Recreation Therapy Notes  Date: 11/10/2018  Time: 9:30 am   Location: Craft room   Behavioral response: N/A   Intervention Topic: Communication  Discussion/Intervention: Patient did not attend group.   Clinical Observations/Feedback:  Patient did not attend group.   Kristoff Coonradt LRT/CTRS        Ashley Griffin 11/10/2018 10:28 AM 

## 2018-11-10 NOTE — Progress Notes (Signed)
Patient with the direct supervision of female staff BHT was given the opportunity to breast pump. Patient able to breast pump in her room with no complications.

## 2018-11-10 NOTE — BHH Counselor (Signed)
Adult Comprehensive Assessment  Patient ID: Ashley Griffin, female   DOB: 06/05/1981, 38 y.o.   MRN: [id number]  Information Source: Information source: Patient  Current Stressors:  Patient states their primary concerns and needs for treatment are:: Pt reports "thought that I was having a psychootic break. I hadn't slept in 5 days.  I thought someone was trying to kill me." Patient states their goals for this hospitilization and ongoing recovery are:: Pt rreports "just to make sure that I am not a zombie.  And get some sleep.  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  An older lady called recently and was afrais to get treatment because of Covid.  It just broke my heart." Bereavement / Loss: Pt reports "not one that is really close".   Living/Environment/Situation:  Living Arrangements: Spouse/significant other Living conditions (as described by patient or guardian): Pt reports "good". Who else lives in the home?: Pt lives with husband and three children. How long has patient lived in current situation?: Pt reports "a couple of years".  What is atmosphere in current home: Comfortable, Loving, Supportive  Family History:  Marital status: Married Number of Years Married: 17 What types of issues is patient dealing with in the relationship?: Pt reports "he's been really busy and I thought that I could be better.  When all this started I needed a lot of support." Are you sexually active?: Yes What is your sexual orientation?: Heterosexual Does patient have children?: Yes How many children?: 3 How is patient's relationship with their children?: Pt has a 106, 4 and 50 month old.  Pt reports relationship is "good".   Childhood History:  By whom was/is the patient raised?: Both parents Description of patient's relationship with caregiver when they were a child: Pt reports "they were good and loving parents". Patient's description of current  relationship with people who raised him/her: Pt reports "it's good". How were you disciplined when you got in trouble as a child/adolescent?: Pt reports "I was spanked if I did something wrong".  Does patient have siblings?: Yes Number of Siblings: 1 Description of patient's current relationship with siblings: Pt reports "good but we're not all that close".  Did patient suffer any verbal/emotional/physical/sexual abuse as a child?: No Did patient suffer from severe childhood neglect?: No Has patient ever been sexually abused/assaulted/raped as an adolescent or adult?: No Was the patient ever a victim of a crime or a disaster?: No Witnessed domestic violence?: No Has patient been effected by domestic violence as an adult?: No  Education:  Highest grade of school patient has completed: Chief Operating Officer Currently a Consulting civil engineer?: No Learning disability?: No  Employment/Work Situation:   Employment situation: Employed Where is patient currently employed?: Pt works for [employer] as a Charity fundraiser. How long has patient been employed?: Pt reports 2-3 years. Patient's job has been impacted by current illness: Yes Describe how patient's job has been impacted: Pt rpeorts anxiety from the number of calls that she receives regarding Covid-19. What is the longest time patient has a held a job?: 10 years Where was the patient employed at that time?: Pt reports that she worked for [employer] system.   Did You Receive Any Psychiatric Treatment/Services While in the Military?: No(NA) Are There Guns or Other Weapons in Your Home?: Yes Types of Guns/Weapons: Guns Are These Weapons Safely Secured?: Yes  Financial Resources:   Financial resources: Income from employment, Private insurance Does patient have  a representative payee or guardian?: No  Alcohol/Substance Abuse:   What has been your use of drugs/alcohol within the last 12 months?: Pt denies. If attempted suicide, did drugs/alcohol play a  role in this?: No(Pt denies past suicidal attempts.) Alcohol/Substance Abuse Treatment Hx: Denies past history Has alcohol/substance abuse ever caused legal problems?: No  Social Support System:   Patient's Community Support System: Good Describe Community Support System: Pt reports "several friends and family".  Type of faith/religion: Pt reports "Christian".  How does patient's faith help to cope with current illness?: Pt reports "I've been reading my Bibleand praying".   Leisure/Recreation:   Leisure and Hobbies: Pt reports "I don't really have any right now.  If I did it would be exercising, reading adn spending more time with my kids."  Strengths/Needs:   What is the patient's perception of their strengths?: Pt reports "I think I do everything well that is probably why I am here." Patient states these barriers may affect/interfere with their treatment: Pt denies barriers ar this time.  Patient states these barriers may affect their return to the community: Pt denies barriers ar this time.  Other important information patient would like considered in planning for their treatment: Pt reports that she struggles with balance and self-care.   Discharge Plan:   Currently receiving community mental health services: No Patient states concerns and preferences for aftercare planning are: Pt reports that she is open to a therapist, however, would like a female faith based counselor. Pt reports that she is also interested in having her pastor becoming her counselor. Patient states they will know when they are safe and ready for discharge when: Pt reports "when I'm not cloudy and when I'm calm." Does patient have access to transportation?: Yes Does patient have financial barriers related to discharge medications?: Yes Patient description of barriers related to discharge medications: Pt has [employer] Will patient be returning to same living situation after discharge?: Yes  Summary/Recommendations:    Summary and Recommendations (to be completed by the evaluator): Patient is a 38 year old married female from [place], [place] [facility]).  Patient reports that she has [employer]. 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  She presents to the hospital as an involuntary commitment due to concerns about the patient's reported lack of sleep, anxiety, concerns of Covid-19 and passive suicidal statements.  She has a Major Depressive Disorder, single episode, severe with psychosis.  Recommendations include crisis stabilization, therapeutic milieu, encourage group attendance and participation, medication management for mood stabilization and development of comprehensive mental wellness plan.    Harden Mo. 11/10/2018

## 2018-11-10 NOTE — Progress Notes (Signed)
D: Pt during assessments denies SI/HI/AVH, able to contract for safety. Pt is pleasant and cooperative with this writer, but visibly anxious and sad. Pt. Is a bit tearful at times, speaking to this writer about what has brought her here. Pt. 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 Pt. Feels she is experiancing paranoia and describes feeling sometimes like something bad is about to happen and times when she feels like she is being hunted or searched for to be killed.   A: Q x 15 minute observation checks to be completed for safety. Patient was provided with extensive education. Patient was given/offered medications per orders. Patient was encourage to attend groups, participate in unit activities and continue with plan of care. Pt. Chart and plans of care reviewed. Pt. Given support and encouragement.   R: Patient is complaint with medications this morning, besides claritin stating, "oh I don't take that anymore, I'm good" and unit procedures. Pt. Eating fair, observed going to meals.

## 2018-11-10 NOTE — BHH Group Notes (Signed)
Feelings Around Relapse 11/10/2018 1PM  Type of Therapy and Topic:  Group Therapy:  Feelings around Relapse and Recovery  Participation Level:  Active   Description of Group:    Patients in this group will discuss emotions they experience before and after a relapse. They will process how experiencing these feelings, or avoidance of experiencing them, relates to having a relapse. Facilitator will guide patients to explore emotions they have related to recovery. Patients will be encouraged to process which emotions are more powerful. They will be guided to discuss the emotional reaction significant others in their lives may have to patients' relapse or recovery. Patients will be assisted in exploring ways to respond to the emotions of others without this contributing to a relapse.  Therapeutic Goals: 1. Patient will identify two or more emotions that lead to a relapse for them 2. Patient will identify two emotions that result when they relapse 3. Patient will identify two emotions related to recovery 4. Patient will demonstrate ability to communicate their needs through discussion and/or role plays   Summary of Patient Progress:  Actively and appropriately engaged in the group. Patient was able to provide support and validation to other group members.Patient practiced active listening when interacting with the facilitator and other group members. Patient identified her struggles with balancing parenthood as trigger for relapse. Patient discussed with the group self care tips she is willing to practice when she returns home to prevent relapse.    Therapeutic Modalities:   Cognitive Behavioral Therapy Solution-Focused Therapy Assertiveness Training Relapse Prevention Therapy   Suzan Slick, LCSW 11/10/2018 2:13 PM

## 2018-11-10 NOTE — Progress Notes (Signed)
Patient asked if she needed to pump her breasts per lactation consult, but patient denies current need. Will continue to offer.

## 2018-11-10 NOTE — BHH Suicide Risk Assessment (Signed)
[facility] Admission Suicide Risk Assessment   Nursing information obtained from:  Patient Demographic factors:  NA Current Mental Status:  NA Loss Factors:  NA Historical Factors:  NA Risk Reduction Factors:  Positive therapeutic relationship  Total Time spent with patient: 1 hour Principal Problem: Brief reactive psychosis (HCC) Diagnosis:  Principal Problem:   Brief reactive psychosis (HCC) Active Problems:   Depression   Anxiety state  Subjective Data: Patient seen and interviewed.  Chart reviewed.  Patient seems to be doing better than she was when she was in the emergency room.  She is not making any psychotic statements at this time.  Mood is still anxious and slightly depressed.  She denies any suicidal or homicidal thoughts at all.  Denies any hallucinations or psychosis.  Continued Clinical Symptoms:  Alcohol Use Disorder Identification Test Final Score (AUDIT): 4 The "Alcohol Use Disorders Identification Test", Guidelines for Use in Primary Care, Second Edition.  World Science writer [facility]). Score between 0-7:  no or low risk or alcohol related problems. Score between 8-15:  moderate risk of alcohol related problems. Score between 16-19:  high risk of alcohol related problems. Score 20 or above:  warrants further diagnostic evaluation for alcohol dependence and treatment.   CLINICAL FACTORS:   Currently Psychotic   Musculoskeletal: Strength & Muscle Tone: within normal limits Gait & Station: normal Patient leans: N/A  Psychiatric Specialty Exam: Physical Exam  Nursing note and vitals reviewed. Constitutional: She appears well-developed and well-nourished.  HENT:  Head: Normocephalic and atraumatic.  Eyes: Pupils are equal, round, and reactive to light. Conjunctivae are normal.  Neck: Normal range of motion.  Cardiovascular: Regular rhythm and normal heart sounds.  Respiratory: Effort normal. No respiratory distress.  GI: Soft.  Musculoskeletal: Normal range of  motion.  Neurological: She is alert.  Skin: Skin is warm and dry.  Psychiatric: Her mood appears anxious. Her speech is tangential. She is not agitated and not aggressive. Thought content is not paranoid and not delusional. She expresses no homicidal and no suicidal ideation. She exhibits abnormal recent memory.    Review of Systems  Constitutional: Negative.   HENT: Negative.   Eyes: Negative.   Respiratory: Negative.   Cardiovascular: Negative.   Gastrointestinal: Negative.   Musculoskeletal: Negative.   Skin: Negative.   Neurological: Negative.   Psychiatric/Behavioral: Positive for depression and memory loss. Negative for hallucinations, substance abuse and suicidal ideas. The patient is nervous/anxious and has insomnia.     Blood pressure 136/88, pulse 98, temperature 98.6 F (37 C), temperature source Oral, resp. rate 18, height 5\' 1"  (1.549 m), weight 68.9 kg, last menstrual period 11/09/2018, SpO2 99 %.Body mass index is 28.72 kg/m.  General Appearance: Casual  Eye Contact:  Fair  Speech:  Slow  Volume:  Decreased  Mood:  Anxious and Dysphoric  Affect:  Constricted  Thought Process:  Coherent  Orientation:  Full (Time, Place, and Person)  Thought Content:  Logical and Rumination  Suicidal Thoughts:  No  Homicidal Thoughts:  No  Memory:  Immediate;   Fair Recent;   Fair Remote;   Fair  Judgement:  Fair  Insight:  Fair  Psychomotor Activity:  Restlessness  Concentration:  Concentration: Fair  Recall:  [employer] of Knowledge:  Fair  Language:  Fair  Akathisia:  No  Handed:  Right  AIMS (if indicated):     Assets:  Desire for Improvement Housing Physical Health Resilience Social Support  ADL's:  Intact  Cognition:  WNL  Sleep:  Number of Hours: 6.5      COGNITIVE FEATURES THAT CONTRIBUTE TO RISK:  Loss of executive function    SUICIDE RISK:   Minimal: No identifiable suicidal ideation.  Patients presenting with no risk factors but with morbid  ruminations; may be classified as minimal risk based on the severity of the depressive symptoms  PLAN OF CARE: Patient had a brief psychotic episode.  No evidence that she hurt her self.  Seems to be recovering already.  15-minute checks in place.  Try to engage her in appropriate groups and activities on the unit.  We discussed medication management and will try to come up with something that best fits her needs.  Ongoing reassessment before discharge  I certify that inpatient services furnished can reasonably be expected to improve the patient's condition.   Mordecai Rasmussen, MD 11/10/2018, 2:25 PM

## 2018-11-10 NOTE — BHH Suicide Risk Assessment (Signed)
B[facility] INPATIENT:  Family/Significant Other Suicide Prevention Education  Suicide Prevention Education:  Contact Attempts: Adrianne Maready, husband, [phone] has been identified by the patient as the family member/significant other with whom the patient will be residing, and identified as the person(s) who will aid the patient in the event of a mental health crisis.  With written consent from the patient, two attempts were made to provide suicide prevention education, prior to and/or following the patient's discharge.  We were unsuccessful in providing suicide prevention education.  A suicide education pamphlet was given to the patient to share with family/significant other.  Date and time of first attempt: 11/10/2018 at 1:23pm Date and time of second attempt: Second attempt is needed.  CSW notes that she attempted to call the number listed twice, however, the line was picked up then hung up.  CSW was unable to leave a HIPAA compliant voicemail at this time or return call information.   Harden Mo 11/10/2018, 1:23 PM

## 2018-11-10 NOTE — Tx Team (Addendum)
Interdisciplinary Treatment and Diagnostic Plan Update  11/10/2018 Time of Session: 2:30pm Ashley Griffin MRN: [id number]  Principal Diagnosis: Brief reactive psychosis [facility])  Secondary Diagnoses: Principal Problem:   Brief reactive psychosis (HCC) Active Problems:   Depression   Anxiety state   Current Medications:  Current Facility-Administered Medications  Medication Dose Route Frequency Provider Last Rate Last Dose  . acetaminophen (TYLENOL) tablet 650 mg  650 mg Oral Q6H PRN Catalina Gravel, NP      . alum & mag hydroxide-simeth (MAALOX/MYLANTA) 200-200-20 MG/5ML suspension 30 mL  30 mL Oral Q4H PRN Thomspon, Adela Lank, NP      . diltiazem (CARDIZEM) tablet 60 mg  60 mg Oral Q12H Thomspon, Adela Lank, NP   60 mg at 11/10/18 0808  . feeding supplement (ENSURE ENLIVE) (ENSURE ENLIVE) liquid 237 mL  237 mL Oral BID BM Clapacs, John T, MD   237 mL at 11/10/18 1355  . hydrOXYzine (ATARAX/VISTARIL) tablet 25 mg  25 mg Oral Q6H PRN Catalina Gravel, NP   25 mg at 11/09/18 2051  . ibuprofen (ADVIL,MOTRIN) tablet 400 mg  400 mg Oral Q6H PRN Catalina Gravel, NP      . loratadine (CLARITIN) tablet 10 mg  10 mg Oral Daily Catalina Gravel, NP   10 mg at 11/09/18 2139  . LORazepam (ATIVAN) tablet 2 mg  2 mg Oral Q4H PRN Clapacs, Jackquline Denmark, MD   2 mg at 11/10/18 0433  . magnesium hydroxide (MILK OF MAGNESIA) suspension 30 mL  30 mL Oral Daily PRN Catalina Gravel, NP      . multivitamin with minerals tablet 1 tablet  1 tablet Oral Daily Clapacs, Jackquline Denmark, MD   1 tablet at 11/10/18 1010  . sertraline (ZOLOFT) tablet 25 mg  25 mg Oral Daily Catalina Gravel, NP   25 mg at 11/10/18 0808  . traZODone (DESYREL) tablet 50 mg  50 mg Oral QHS Catalina Gravel, NP   50 mg at 11/09/18 2051   PTA Medications: Medications Prior to Admission  Medication Sig Dispense Refill Last Dose  . diltiazem (CARDIZEM) 60 MG tablet Take 60 mg by mouth every 12 (twelve) hours.     .  fexofenadine (ALLEGRA) 180 MG tablet Take 180 mg by mouth as needed.     . hydrOXYzine (ATARAX/VISTARIL) 25 MG tablet Take 25 mg by mouth every 6 (six) hours as needed for sleep.   11/09/2018 at 0700  . ibuprofen (ADVIL,MOTRIN) 200 MG tablet Take 600 mg by mouth every 6 (six) hours as needed for pain.     Marland Kitchen sertraline (ZOLOFT) 50 MG tablet Take 25 mg by mouth daily.     . traZODone (DESYREL) 50 MG tablet Take 50-100 mg by mouth at bedtime.       Patient Stressors: Financial difficulties Marital or family conflict Medication change or noncompliance Occupational concerns  Patient Strengths: Average or above average Radio producer for treatment/growth Religious Affiliation  Treatment Modalities: Medication Management, Group therapy, Case management,  1 to 1 session with clinician, Psychoeducation, Recreational therapy.   Physician Treatment Plan for Primary Diagnosis: Brief reactive psychosis (HCC) Long Term Goal(s):     Short Term Goals:    Medication Management: Evaluate patient's response, side effects, and tolerance of medication regimen.  Therapeutic Interventions: 1 to 1 sessions, Unit Group sessions and Medication administration.  Evaluation of Outcomes: Progressing  Physician Treatment Plan for Secondary Diagnosis: Principal Problem:   Brief reactive psychosis (HCC) Active Problems:   Depression   Anxiety state  Long Term Goal(s):     Short Term Goals:       Medication Management: Evaluate patient's response, side effects, and tolerance of medication regimen.  Therapeutic Interventions: 1 to 1 sessions, Unit Group sessions and Medication administration.  Evaluation of Outcomes: Progressing   RN Treatment Plan for Primary Diagnosis: Brief reactive psychosis (HCC) Long Term Goal(s): Knowledge of disease and therapeutic regimen to maintain health will improve  Short Term Goals: Ability to demonstrate self-control, Ability to  participate in decision making will improve, Ability to verbalize feelings will improve, Ability to disclose and discuss suicidal ideas and Ability to identify and develop effective coping behaviors will improve  Medication Management: RN will administer medications as ordered by provider, will assess and evaluate patient's response and provide education to patient for prescribed medication. RN will report any adverse and/or side effects to prescribing provider.  Therapeutic Interventions: 1 on 1 counseling sessions, Psychoeducation, Medication administration, Evaluate responses to treatment, Monitor vital signs and CBGs as ordered, Perform/monitor CIWA, COWS, AIMS and Fall Risk screenings as ordered, Perform wound care treatments as ordered.  Evaluation of Outcomes: Progressing   LCSW Treatment Plan for Primary Diagnosis: Brief reactive psychosis (HCC) Long Term Goal(s): Safe transition to appropriate next level of care at discharge, Engage patient in therapeutic group addressing interpersonal concerns.  Short Term Goals: Engage patient in aftercare planning with referrals and resources, Increase social support, Increase ability to appropriately verbalize feelings, Increase emotional regulation and Facilitate acceptance of mental health diagnosis and concerns  Therapeutic Interventions: Assess for all discharge needs, 1 to 1 time with Social worker, Explore available resources and support systems, Assess for adequacy in community support network, Educate family and significant other(s) on suicide prevention, Complete Psychosocial Assessment, Interpersonal group therapy.  Evaluation of Outcomes: Progressing   Progress in Treatment: Attending groups: Yes. Participating in groups: Yes. Taking medication as prescribed: Yes. Toleration medication: Yes. Family/Significant other contact made: Yes, individual(s) contacted:  SPE attempts have been made with the patient's husband Patient understands  diagnosis: Yes. Discussing patient identified problems/goals with staff: Yes. Medical problems stabilized or resolved: Yes. Denies suicidal/homicidal ideation: Yes. Issues/concerns per patient self-inventory: No. Other: none  New problem(s) identified: No, Describe:  none  New Short Term/Long Term Goal(s): elimination of symptoms of psychosis, medication management for mood stabilization; elimination of SI thoughts; development of comprehensive mental wellness plan.  Patient Goals:  "get better and go home.  Rest. I wasn't sleeping."  Discharge Plan or Barriers: Patient reports plans to return home. Patient was provided a list of female faith based counselors in her area that accept her insurance.   Reason for Continuation of Hospitalization: Anxiety Depression Mania Medication stabilization  Estimated Length of Stay: 1-5 days  Recreational Therapy: Patient Stressors: N/A Patient Goal: Patient will engage in groups without prompting or encouragement from LRT x3 group sessions within 5 recreation therapy group sessions  Attendees: Patient: Ashley Griffin 11/10/2018 3:22 PM  Physician: Dr. Toni Amend, MD 11/10/2018 3:22 PM  Nursing: Milas Hock, RN 11/10/2018 3:22 PM  RN Care Manager: 11/10/2018 3:22 PM  Social Worker: [name], LCSW 11/10/2018 3:22 PM  Recreational Therapist: Garret Reddish, Drue Flirt, LRT 11/10/2018 3:22 PM  Other:  11/10/2018 3:22 PM  Other:  11/10/2018 3:22 PM  Other: 11/10/2018 3:22 PM    Scribe for Treatment Team: Harden Mo, LCSW 11/10/2018 3:22 PM

## 2018-11-10 NOTE — Progress Notes (Signed)
Recreation Therapy Notes  INPATIENT RECREATION THERAPY ASSESSMENT  Patient Details Name: Ashley Griffin MRN: [id number] DOB: 1981-02-26 Today's Date: 11/10/2018       Information Obtained From: Patient  Able to Participate in Assessment/Interview: Yes  Patient Presentation: Responsive  Reason for Admission (Per Patient): Active Symptoms, Other (Comments)(No self-care)  Patient Stressors:    Coping Skills:   Music, Exercise, Read  Leisure Interests (2+):  Social - Family  Frequency of Recreation/Participation: Weekly  Awareness of Community Resources:  Yes  Community Resources:  [place], [place]  Current Use:    If no, Barriers?:    Expressed Interest in [employer] Information:    [place] of Residence:  Film/video editor  Patient Main Form of Transportation: Set designer  Patient Strengths:  Intelligent, loyal, tender  Patient Identified Areas of Improvement:  Not letting fear get in the way and learn to trust  Patient Goal for Hospitalization:  To get back to normal state of mind and caught up on sleep  Current SI (including self-harm):  No  Current HI:  No  Current AVH: No  Staff Intervention Plan: Group Attendance, Collaborate with Interdisciplinary Treatment Team  Consent to Intern Participation: N/A  Ashley Griffin 11/10/2018, 3:21 PM

## 2018-11-10 NOTE — [facility] Counselor (Signed)
CSW completed assessment with the patient and asked if there are any concerns at this time.    Pt asked about her medication names and doses.  Pt also asked about visiting times and phone hours.    CSW informed the patient that she was not well versed in medication information and recommended that patient follow up with nurse.  CSW offered to inform the nurse that the patient wanted to discuss meds.  Pt agreed.  CSW informed nurse that patient had questions on her medications that CSW did not feel comfortable answering.  Per nurse he has explained this to patient previously.   CSW wrote visitng hours and phone hours on sticky notes and provided to patient in her room.    Pt also requested information on female, faith based counselors and CSW provided the patient with information.  CSW assessed if patient had aversion to current nurse who was female and pt reports that she is okay with female nurse.   [name], MSW, LCSW 11/10/2018 1:20 PM

## 2018-11-10 NOTE — Plan of Care (Signed)
Patient alert and oriented. Appears to utilize techniques to improve thinking and accepting the prescribed therapeutic regimen. Her coping skills are improved as evidenced by her positive expression of how to adapt to the demands of her life and self concept. She seems calm and mentally appropriate at this time. The information provided during this admission seems distant to her but encouragement and explanation seem helpful.

## 2018-11-10 NOTE — Progress Notes (Signed)
D- Patient alert and oriented. Her mood and affect seem appropriate to situation. Denies SI, HI, AVH, and pain. Quotes. Goal: She wants to go to home to her children but has reluctance to take prescribed medications. A- Scheduled medications administered to patient, per MD orders. Support and encouragement provided.  Routine safety checks conducted every 15 minutes.  Patient informed to notify staff with problems or concerns. R- No adverse drug reactions noted. Patient contracts for safety at this time. Patient compliant with medications and treatment plan. Patient receptive, calm, and cooperative. Patient interacts well with others on the unit.  Patient remains safe at this time.

## 2018-11-10 NOTE — H&P (Signed)
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  Patient was brought to the hospital yesterday after her husband found her agitated and psychotic at home.  See the emergency room notes but the patient basically was brought in after she called her husband at work saying that she needed him to come home.  He found her confused and agitated.  There was a suggestion that she may have made suicidal comments.  On interview today the patient denies having ever had any thoughts about suicide but does admit to having made comments that the family would be better off without her.  Patient is documented as having talked about Satan telling her that she was going to hell and having woken up in the night with visual hallucinations.  Patient tells me that this all built up over several days starting last weekend.  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  The current public health crisis has caused her job to be overwhelmed with calls and overwhelmed with anxiety on the part of many patients.  She felt a great deal of emotional stress.  Could not sleep for 2 or 3 days.  Felt panicky and anxious over the weekend.  Could not work this week and symptoms apparently just got worse.  On interview today the patient denies any auditory or visual hallucinations.  Denies any suicidal or homicidal ideation.  She does not drink does not abuse any drugs and is not getting any outpatient psychiatric treatment. Associated Signs/Symptoms: Depression Symptoms:  depressed mood, psychomotor agitation, feelings of worthlessness/guilt, difficulty concentrating, (Hypo)  Manic Symptoms:  Distractibility, Anxiety Symptoms:  Excessive Worry, Psychotic Symptoms:  Hallucinations: Auditory Visual PTSD Symptoms: The patient asks me about PTSD but the kind of event she has been going through the last week or so do not sound like PTSD type trauma.  No previous history of PTSD Total Time spent with patient: 1 hour  Past Psychiatric History: Patient says that she thinks she has always had "a lot of anxiety".  Nevertheless she has never seen a counselor or therapist or mental health provider.  Never been on any psychiatric medicine.  Never had psychiatric hospitalization.  She tells me several times that she "does not like" to take medicine.  She seems to be indicating that she has a lot of trouble accepting having had a mental health incident.  No history of suicide attempts no history of violence.  Is the patient at risk to self? Yes.    Has the patient been a risk to self in the past 6 months? No.  Has the patient been a risk to self within the distant past? No.  Is the patient a risk to others? No.  Has the patient been a risk to others in the past 6 months? No.  Has the patient been a risk to others within the distant past? No.   Prior Inpatient Therapy:   Prior Outpatient Therapy:    Alcohol Screening: 1. How often do you have a drink containing alcohol?: Monthly or less 2. How many drinks containing alcohol do you have on a typical day when you are drinking?: 1 or 2 3. How often do you  have six or more drinks on one occasion?: Less than monthly AUDIT-C Score: 2 4. How often during the last year have you found that you were not able to stop drinking once you had started?: Less than monthly 6. How often during the last year have you needed a first drink in the morning to get yourself going after a heavy drinking session?: Less than monthly 7. How often during the last year have you had a feeling of guilt of remorse after drinking?: Never 9. Have you or someone  else been injured as a result of your drinking?: No 10. Has a relative or friend or a doctor or another health worker been concerned about your drinking or suggested you cut down?: No Alcohol Use Disorder Identification Test Final Score (AUDIT): 4 Alcohol Brief Interventions/Follow-up: Alcohol Education Substance Abuse History in the last 12 months:  No. Consequences of Substance Abuse: Negative Previous Psychotropic Medications: No  Psychological Evaluations: No  Past Medical History: History reviewed. No pertinent past medical history. History reviewed. No pertinent surgical history. Family History: History reviewed. No pertinent family history. Family Psychiatric  History: Patient says her mother is also anxious but denies any other mental health problems in the family Tobacco Screening: Have you used any form of tobacco in the last 30 days? (Cigarettes, Smokeless Tobacco, Cigars, and/or Pipes): No Social History:  Social History   Substance and Sexual Activity  Alcohol Use Not Currently  . Frequency: Never     Social History   Substance and Sexual Activity  Drug Use Not on file    Additional Social History: Marital status: Married Number of Years Married: 17 What types of issues is patient dealing with in the relationship?: Pt reports "he's been really busy and I thought that I could be better.  When all this started I needed a lot of support." Are you sexually active?: Yes What is your sexual orientation?: Heterosexual Does patient have children?: Yes How many children?: 3 How is patient's relationship with their children?: Pt has a 16, 70 and 28 month old.  Pt reports relationship is "good".                          Allergies:   Allergies  Allergen Reactions  . Moxifloxacin Hives    Other reaction(s): HIVES   Lab Results:  Results for orders placed or performed during the hospital encounter of 11/09/18 (from the past 48 hour(s))  Comprehensive metabolic panel      Status: Abnormal   Collection Time: 11/09/18 11:41 AM  Result Value Ref Range   Sodium 135 135 - 145 mmol/L   Potassium 3.5 3.5 - 5.1 mmol/L   Chloride 102 98 - 111 mmol/L   CO2 21 (L) 22 - 32 mmol/L   Glucose, Bld 151 (H) 70 - 99 mg/dL   BUN 10 6 - 20 mg/dL   Creatinine, Ser 3.64 0.44 - 1.00 mg/dL   Calcium 9.3 8.9 - 68.0 mg/dL   Total Protein 9.0 (H) 6.5 - 8.1 g/dL   Albumin 4.6 3.5 - 5.0 g/dL   AST 21 15 - 41 U/L   ALT 21 0 - 44 U/L   Alkaline Phosphatase 64 38 - 126 U/L   Total Bilirubin 0.6 0.3 - 1.2 mg/dL   GFR calc non Af Amer >60 >60 mL/min   GFR calc Af Amer >60 >60 mL/min   Anion gap 12 5 - 15    Comment: Performed at  [facility], [address], [place], [place] [address]  Ethanol     Status: None   Collection Time: 11/09/18 11:41 AM  Result Value Ref Range   Alcohol, Ethyl (B) <10 <10 mg/dL    Comment: (NOTE) Lowest detectable limit for serum alcohol is 10 mg/dL. For medical purposes only. Performed at [facility], [address] [place], [place] [address]   Salicylate level     Status: None   Collection Time: 11/09/18 11:41 AM  Result Value Ref Range   Salicylate Lvl <7.0 2.8 - 30.0 mg/dL    Comment: Performed at [facility], [address] [place], [place] [address]  Acetaminophen level     Status: Abnormal   Collection Time: 11/09/18 11:41 AM  Result Value Ref Range   Acetaminophen (Tylenol), Serum <10 (L) 10 - 30 ug/mL    Comment: (NOTE) Therapeutic concentrations vary significantly. A range of 10-30 ug/mL  may be an effective concentration for many patients. However, some  are best treated at concentrations outside of this range. Acetaminophen concentrations >150 ug/mL at 4 hours after ingestion  and >50 ug/mL at 12 hours after ingestion are often associated with  toxic reactions. Performed at [facility], [address] [place], [place] [address]   cbc     Status: None   Collection Time: 11/09/18  11:41 AM  Result Value Ref Range   WBC 8.0 4.0 - 10.5 K/uL   RBC 5.08 3.87 - 5.11 MIL/uL   Hemoglobin 14.1 12.0 - 15.0 g/dL   HCT 30.8 65.7 - 84.6 %   MCV 85.2 80.0 - 100.0 fL   MCH 27.8 26.0 - 34.0 pg   MCHC 32.6 30.0 - 36.0 g/dL   RDW 96.2 95.2 - 84.1 %   Platelets 292 150 - 400 K/uL   nRBC 0.0 0.0 - 0.2 %    Comment: Performed at [facility], [address] [place], [place] [address]  Urine Drug Screen, Qualitative     Status: None   Collection Time: 11/09/18  1:18 PM  Result Value Ref Range   Tricyclic, Ur Screen NONE DETECTED NONE DETECTED   Amphetamines, Ur Screen NONE DETECTED NONE DETECTED   MDMA (Ecstasy)Ur Screen NONE DETECTED NONE DETECTED   Cocaine Metabolite,Ur [place] NONE DETECTED NONE DETECTED   Opiate, Ur Screen NONE DETECTED NONE DETECTED   Phencyclidine (PCP) Ur S NONE DETECTED NONE DETECTED   Cannabinoid 50 Ng, Ur [place] NONE DETECTED NONE DETECTED   Barbiturates, Ur Screen NONE DETECTED NONE DETECTED   Benzodiazepine, Ur Scrn NONE DETECTED NONE DETECTED   Methadone Scn, Ur NONE DETECTED NONE DETECTED    Comment: (NOTE) Tricyclics + metabolites, urine    Cutoff 1000 ng/mL Amphetamines + metabolites, urine  Cutoff 1000 ng/mL MDMA (Ecstasy), urine              Cutoff 500 ng/mL Cocaine Metabolite, urine          Cutoff 300 ng/mL Opiate + metabolites, urine        Cutoff 300 ng/mL Phencyclidine (PCP), urine         Cutoff 25 ng/mL Cannabinoid, urine                 Cutoff 50 ng/mL Barbiturates + metabolites, urine  Cutoff 200 ng/mL Benzodiazepine, urine              Cutoff 200 ng/mL Methadone, urine  Cutoff 300 ng/mL The urine drug screen provides only a preliminary, unconfirmed analytical test result and should not be used for non-medical purposes. Clinical consideration and professional judgment should be applied to any positive drug screen result due to possible interfering substances. A more specific alternate chemical method must be  used in order to obtain a confirmed analytical result. Gas chromatography / mass spectrometry (GC/MS) is the preferred confirmat ory method. Performed at [facility], [address] [place], [place] [address]   Pregnancy, urine POC     Status: None   Collection Time: 11/09/18  1:31 PM  Result Value Ref Range   Preg Test, Ur NEGATIVE NEGATIVE    Comment:        THE SENSITIVITY OF THIS METHODOLOGY IS >24 mIU/mL     Blood Alcohol level:  Lab Results  Component Value Date   ETH <10 11/09/2018    Metabolic Disorder Labs:  No results found for: HGBA1C, MPG No results found for: PROLACTIN No results found for: CHOL, TRIG, HDL, CHOLHDL, VLDL, LDLCALC  Current Medications: Current Facility-Administered Medications  Medication Dose Route Frequency Provider Last Rate Last Dose  . acetaminophen (TYLENOL) tablet 650 mg  650 mg Oral Q6H PRN Catalina Gravel, NP      . alum & mag hydroxide-simeth (MAALOX/MYLANTA) 200-200-20 MG/5ML suspension 30 mL  30 mL Oral Q4H PRN Thomspon, Adela Lank, NP      . diltiazem (CARDIZEM) tablet 60 mg  60 mg Oral Q12H [place], Adela Lank, NP   60 mg at 11/10/18 0808  . feeding supplement (ENSURE ENLIVE) (ENSURE ENLIVE) liquid 237 mL  237 mL Oral BID BM Muriel Wilber T, MD   237 mL at 11/10/18 1355  . hydrOXYzine (ATARAX/VISTARIL) tablet 25 mg  25 mg Oral Q6H PRN Catalina Gravel, NP   25 mg at 11/09/18 2051  . ibuprofen (ADVIL,MOTRIN) tablet 400 mg  400 mg Oral Q6H PRN Catalina Gravel, NP      . loratadine (CLARITIN) tablet 10 mg  10 mg Oral Daily Catalina Gravel, NP   10 mg at 11/09/18 2139  . LORazepam (ATIVAN) tablet 2 mg  2 mg Oral Q4H PRN Leita Lindbloom, Jackquline Denmark, MD   2 mg at 11/10/18 0433  . magnesium hydroxide (MILK OF MAGNESIA) suspension 30 mL  30 mL Oral Daily PRN Catalina Gravel, NP      . multivitamin with minerals tablet 1 tablet  1 tablet Oral Daily Juli Odom, Jackquline Denmark, MD   1 tablet at 11/10/18 1010  . QUEtiapine  (SEROQUEL) tablet 100 mg  100 mg Oral QHS PRN Hasheem Voland T, MD      . sertraline (ZOLOFT) tablet 25 mg  25 mg Oral Daily Catalina Gravel, NP   25 mg at 11/10/18 0808  . traZODone (DESYREL) tablet 50 mg  50 mg Oral QHS Catalina Gravel, NP   50 mg at 11/09/18 2051   PTA Medications: Medications Prior to Admission  Medication Sig Dispense Refill Last Dose  . diltiazem (CARDIZEM) 60 MG tablet Take 60 mg by mouth every 12 (twelve) hours.     . fexofenadine (ALLEGRA) 180 MG tablet Take 180 mg by mouth as needed.     . hydrOXYzine (ATARAX/VISTARIL) 25 MG tablet Take 25 mg by mouth every 6 (six) hours as needed for sleep.   11/09/2018 at 0700  . ibuprofen (ADVIL,MOTRIN) 200 MG tablet Take 600 mg by mouth every 6 (six) hours as needed for pain.     Marland Kitchen sertraline (ZOLOFT) 50 MG tablet  Take 25 mg by mouth daily.     . traZODone (DESYREL) 50 MG tablet Take 50-100 mg by mouth at bedtime.       Musculoskeletal: Strength & Muscle Tone: within normal limits Gait & Station: normal Patient leans: N/A  Psychiatric Specialty Exam: Physical Exam  Nursing note and vitals reviewed. Constitutional: She appears well-developed and well-nourished.  HENT:  Head: Normocephalic and atraumatic.  Eyes: Pupils are equal, round, and reactive to light. Conjunctivae are normal.  Neck: Normal range of motion.  Cardiovascular: Regular rhythm and normal heart sounds.  Respiratory: Effort normal. No respiratory distress.  GI: Soft.  Musculoskeletal: Normal range of motion.  Neurological: She is alert.  Skin: Skin is warm and dry.  Psychiatric: Her speech is normal. Judgment normal. Her mood appears anxious. She is agitated. She is not aggressive and not hyperactive. Thought content is not paranoid. Cognition and memory are impaired. She expresses no homicidal and no suicidal ideation.    Review of Systems  Constitutional: Negative.   HENT: Negative.   Eyes: Negative.   Respiratory: Negative.    Cardiovascular: Negative.   Gastrointestinal: Negative.   Musculoskeletal: Negative.   Skin: Negative.   Neurological: Negative.   Psychiatric/Behavioral: Positive for depression and memory loss. Negative for hallucinations, substance abuse and suicidal ideas. The patient is nervous/anxious and has insomnia.     Blood pressure 136/88, pulse 98, temperature 98.6 F (37 C), temperature source Oral, resp. rate 18, height 5\' 1"  (1.549 m), weight 68.9 kg, last menstrual period 11/09/2018, SpO2 99 %.Body mass index is 28.72 kg/m.  General Appearance: Casual  Eye Contact:  Fair  Speech:  Clear and Coherent  Volume:  Decreased  Mood:  Anxious  Affect:  Congruent  Thought Process:  Goal Directed  Orientation:  Full (Time, Place, and Person)  Thought Content:  Rumination and Tangential  Suicidal Thoughts:  No  Homicidal Thoughts:  No  Memory:  Immediate;   Fair Recent;   Poor Remote;   Fair  Judgement:  Fair  Insight:  Fair  Psychomotor Activity:  Decreased  Concentration:  Concentration: Fair  Recall:  [employer] of Knowledge:  Fair  Language:  Fair  Akathisia:  No  Handed:  Right  AIMS (if indicated):     Assets:  Communication Skills Desire for Improvement Financial Resources/Insurance Housing Physical Health Resilience Social Support  ADL's:  Intact  Cognition:  WNL  Sleep:  Number of Hours: 6.5    Treatment Plan Summary: Daily contact with patient to assess and evaluate symptoms and progress in treatment, Medication management and Plan Patient is admitted to the psychiatry ward.  15-minute checks are in place.  This is a young woman with no past psychiatric history who presents with several days of acute psychotic decompensation.  She is already showing some recovery.  Patient is employed as a Engineer, civil (consulting).  She has 3 young children at home.  There is no evidence of any danger to anyone else.  There was some concern about possible suicidality although she is denying ever having  had suicidal thoughts.  We discussed medication management.  I suggested to her that the most important thing was to control her acute symptoms so that she would be functional enough for discharge and wanted to treat her with a modest dose of antipsychotic.  The patient had been told by 1 of her friends that she might have "a serotonin thing" and wanted very much to start an antidepressant.  We will continue the  low-dose Zoloft that was started and give her as needed Seroquel at night.  I am hoping that the psychotic symptoms I will continue to be cleared up over the weekend so that perhaps she can be discharged sooner rather than later although if they come back or things get worse over the weekend we may need to intervene further.  Patient understands the plan.  Observation Level/Precautions:  15 minute checks  Laboratory:  Chemistry Profile  Psychotherapy:    Medications:    Consultations:    Discharge Concerns:    Estimated LOS:  Other:     Physician Treatment Plan for Primary Diagnosis: Brief reactive psychosis (HCC) Long Term Goal(s): Improvement in symptoms so as ready for discharge  Short Term Goals: Ability to verbalize feelings will improve, Ability to demonstrate self-control will improve and Ability to identify and develop effective coping behaviors will improve  Physician Treatment Plan for Secondary Diagnosis: Principal Problem:   Brief reactive psychosis (HCC) Active Problems:   Depression   Anxiety state  Long Term Goal(s): Improvement in symptoms so as ready for discharge  Short Term Goals: Compliance with prescribed medications will improve  I certify that inpatient services furnished can reasonably be expected to improve the patient's condition.    Mordecai Rasmussen, MD 3/20/20205:24 PM

## 2018-11-10 NOTE — Plan of Care (Addendum)
Pt. Is complaint with medications, besides claritin. Pt. Denies si/hi/avh, able to contract for safety. Pt. Expresses sadness, anxiety, and paranoia.    Problem: Education: Goal: Emotional status will improve Outcome: Not Progressing Goal: Mental status will improve Outcome: Not Progressing   Problem: Health Behavior/Discharge Planning: Goal: Compliance with treatment plan for underlying cause of condition will improve Outcome: Progressing   Problem: Safety: Goal: Periods of time without injury will increase Outcome: Progressing

## 2018-11-10 NOTE — Progress Notes (Signed)
Patient asked if she needed to pump her breasts per lactation consult, but patient denies current need. Will continue to offer.  

## 2018-11-11 NOTE — Progress Notes (Addendum)
D: Pt. During assessments, denies si/hi/avh, contracts for safety. Upon interactions with this writer this morning presents with mild confusion periodically. Pt. For an example educated that there is no pending discharge for this morning, but moments later comes back to the nursing station requesting if there is any updates on her discharge, despite our conversations minutes ago. Pt. When re-educated on discharge education stares at this writer for an unusual amount of time, before just walking away. Pt. Denies anxiety and or depression today, just mostly discharge fixated. Pt. Not requesting discharge, but fixated still. Pt. Eager to speak to the doctor again she expresses to see if the doctor feels she is ready to go.    A: Q x 15 minute observation checks to be completed for safety. Patient was provided with education, but needs reinforcement. Patient was given/offered medications per orders. Patient  was encourage to attend groups, participate in unit activities and continue with plan of care. Pt. Chart and plans of care reviewed. Pt. Given support and encouragement.   R: Patient is complaint with medications and unit procedures this morning. Pt. Eating has improved, eating mostly all of her food. Pt. Takes ensures and drinks them. Pt. Understands that when she would like to breast pump she just can notify staff.

## 2018-11-11 NOTE — Progress Notes (Signed)
[facility] MD Progress Note  11/11/2018 3:40 PM Ashley Griffin  MRN:  [id number] Subjective:    Patient seen.  Chart reviewed. Patient discussed with nursing; no overnight events reported.  Ashley Griffin is a 38yo CM without past psych history, who was admitted to inpatient psychiatry one day ago for medication management and stabilization secondary to agitation at home.    Today patient appears calm. She reports she slept the whole night. She reports feeling "much better". She denies feeling depressed, anxious, suicidal, homicidal. She denies experiencing any hallucinations and does not express any delusions. She denies any physical complaints. She denies any side effects from medications. She reports she was very overwhelmed lately. She is talking about having a break from her children now in the hospital. She is asking about discharge.    Principal Problem: Brief reactive psychosis (HCC) Diagnosis: Principal Problem:   Brief reactive psychosis (HCC) Active Problems:   Depression   Anxiety state  Total Time spent with patient: 15 minutes  Past Psychiatric History: see H&P on admission  Past Medical History: History reviewed. No pertinent past medical history. History reviewed. No pertinent surgical history. Family History: History reviewed. No pertinent family history. Family Psychiatric  History: see H&P on admission Social History:  Social History   Substance and Sexual Activity  Alcohol Use Not Currently  . Frequency: Never     Social History   Substance and Sexual Activity  Drug Use Not on file    Social History   Socioeconomic History  . Marital status: Married    Spouse name: Not on file  . Number of children: Not on file  . Years of education: Not on file  . Highest education level: Not on file  Occupational History  . Not on file  Social Needs  . Financial resource strain: Not on file  . Food insecurity:    Worry: Not on file    Inability: Not on file  .  Transportation needs:    Medical: Not on file    Non-medical: Not on file  Tobacco Use  . Smoking status: Never Smoker  . Smokeless tobacco: Never Used  Substance and Sexual Activity  . Alcohol use: Not Currently    Frequency: Never  . Drug use: Not on file  . Sexual activity: Not on file  Lifestyle  . Physical activity:    Days per week: Not on file    Minutes per session: Not on file  . Stress: Not on file  Relationships  . Social connections:    Talks on phone: Not on file    Gets together: Not on file    Attends religious service: Not on file    Active member of club or organization: Not on file    Attends meetings of clubs or organizations: Not on file    Relationship status: Not on file  Other Topics Concern  . Not on file  Social History Narrative  . Not on file   Additional Social History:                         Sleep: Good  Appetite:  Good  Current Medications: Current Facility-Administered Medications  Medication Dose Route Frequency Provider Last Rate Last Dose  . acetaminophen (TYLENOL) tablet 650 mg  650 mg Oral Q6H PRN Catalina Gravel, NP      . alum & mag hydroxide-simeth (MAALOX/MYLANTA) 200-200-20 MG/5ML suspension 30 mL  30 mL Oral Q4H PRN Thomspon,  Adela Lank, NP      . diltiazem (CARDIZEM) tablet 60 mg  60 mg Oral Q12H Catalina Gravel, NP   60 mg at 11/11/18 5361  . feeding supplement (ENSURE ENLIVE) (ENSURE ENLIVE) liquid 237 mL  237 mL Oral BID BM Clapacs, John T, MD   237 mL at 11/11/18 1334  . hydrOXYzine (ATARAX/VISTARIL) tablet 25 mg  25 mg Oral Q6H PRN Catalina Gravel, NP   25 mg at 11/11/18 0421  . ibuprofen (ADVIL,MOTRIN) tablet 400 mg  400 mg Oral Q6H PRN Catalina Gravel, NP      . loratadine (CLARITIN) tablet 10 mg  10 mg Oral Daily Catalina Gravel, NP   10 mg at 11/11/18 0804  . LORazepam (ATIVAN) tablet 2 mg  2 mg Oral Q4H PRN Clapacs, Jackquline Denmark, MD   2 mg at 11/11/18 0421  . magnesium hydroxide (MILK OF  MAGNESIA) suspension 30 mL  30 mL Oral Daily PRN Catalina Gravel, NP      . multivitamin with minerals tablet 1 tablet  1 tablet Oral Daily Clapacs, Jackquline Denmark, MD   1 tablet at 11/11/18 0804  . QUEtiapine (SEROQUEL) tablet 100 mg  100 mg Oral QHS PRN Clapacs, John T, MD      . sertraline (ZOLOFT) tablet 25 mg  25 mg Oral Daily Catalina Gravel, NP   25 mg at 11/11/18 0804  . traZODone (DESYREL) tablet 50 mg  50 mg Oral QHS Catalina Gravel, NP   50 mg at 11/10/18 2341    Lab Results: No results found for this or any previous visit (from the past 48 hour(s)).  Blood Alcohol level:  Lab Results  Component Value Date   ETH <10 11/09/2018    Metabolic Disorder Labs: No results found for: HGBA1C, MPG No results found for: PROLACTIN No results found for: CHOL, TRIG, HDL, CHOLHDL, VLDL, LDLCALC  Physical Findings: AIMS: Facial and Oral Movements Muscles of Facial Expression: None, normal Lips and Perioral Area: None, normal Jaw: None, normal Tongue: None, normal,Extremity Movements Upper (arms, wrists, hands, fingers): None, normal Lower (legs, knees, ankles, toes): None, normal, Trunk Movements Neck, shoulders, hips: None, normal, Overall Severity Severity of abnormal movements (highest score from questions above): None, normal Incapacitation due to abnormal movements: None, normal Patient's awareness of abnormal movements (rate only patient's report): No Awareness, Dental Status Current problems with teeth and/or dentures?: No Does patient usually wear dentures?: No  CIWA:    COWS:  COWS Total Score: 6  Musculoskeletal: Strength & Muscle Tone: within normal limits Gait & Station: normal Patient leans: N/A  Psychiatric Specialty Exam: Physical Exam  ROS  Blood pressure (!) 130/92, pulse (!) 105, temperature 97.9 F (36.6 C), temperature source Oral, resp. rate 18, height 5\' 1"  (1.549 m), weight 68.9 kg, last menstrual period 11/09/2018, SpO2 99 %.Body mass index is  28.72 kg/m.  General Appearance: Casual  Eye Contact:  Good  Speech:  Normal Rate  Volume:  Decreased  Mood:  Anxious  Affect:  Restricted  Thought Process:  Coherent  Orientation:  Full (Time, Place, and Person)  Thought Content:  Logical  Suicidal Thoughts:  No  Homicidal Thoughts:  No  Memory:  Remote;   Good  Judgement:  Fair  Insight:  Fair  Psychomotor Activity:  Normal  Concentration:  Concentration: Good  Recall:  Good  Fund of Knowledge:  Fair  Language:  Good  Akathisia:  No  Handed:  Right  AIMS (if indicated):     Assets:  Desire for Improvement  ADL's:  Intact  Cognition:  WNL  Sleep:  Number of Hours: 5.5     Treatment Plan Summary: Daily contact with patient to assess and evaluate symptoms and progress in treatment   Ashley Griffin is a 38yo CM without past psych history, who was admitted to inpatient psychiatry one day ago for medication management and stabilization secondary to agitation at home.    Today patient reports mood and sleep improvement. She denies any current mental complaints and is focused on discharge. She denies any side effects from medications. Patient's husband asked to call him for updates. Attempted to call him twice - no answer.  Impression:  Brief psychotic disorder.  Plan: -continue inpatient psych admission;  -15-minute checks  -daily contact with patient to assess and evaluate symptoms and progress in treatment.  -continue Seroquel  PO QHS for psychosis;  -continue Zoloft  PO daily for anxiety  -continue Trazodone  PO QHS for sleep;  -continue Hydroxyzine  PO TID PRN anxiety;  -continue Ativan  PO Q4H PRN severe anxiety.  -psychoeducation;  -encouragement to be involved in the milieu;  -Disposition: to be determined.    Thalia Party, MD 11/11/2018, 3:40 PM

## 2018-11-11 NOTE — Plan of Care (Signed)
Patient became more disoriented  and increasingly anxious as the time goes by. Unable to remember where and who she is--thinks her mother is here and that her child's name is Isabelle Course and her mother's name is [place]. Reoriented as much as possible but anxiety is worsening.

## 2018-11-11 NOTE — Plan of Care (Signed)
Pt. Is complaint with medications. Pt. Monitored for safety, denies si/hi/avh, contracts for safety. Pt. This morning upon interactions with this writer presents with mild confusion periodically. Pt. For an example educated that there is no pending discharge for this morning, but moments later comes back to the nursing station requesting if there is any updates on her discharge, despite our conversations minutes ago. Pt. When re-educated on discharge education stares at this writer for an unusual amount of time, before just walking away.    Problem: Education: Goal: Emotional status will improve Outcome: Not Progressing Goal: Mental status will improve Outcome: Not Progressing   Problem: Health Behavior/Discharge Planning: Goal: Compliance with treatment plan for underlying cause of condition will improve Outcome: Progressing   Problem: Safety: Goal: Periods of time without injury will increase Outcome: Progressing

## 2018-11-11 NOTE — Plan of Care (Signed)
Patient is calm, alert and responding appropriately , minimal socialization , support is provided and safety is maintained no distress .

## 2018-11-11 NOTE — [facility] Group Notes (Signed)
LCSW Group Therapy Note   11/11/2018 1:15pm   Type of Therapy and Topic:  Group Therapy:  Trust and Honesty  Participation Level:  Active  Description of Group:    In this group patients will be asked to explore the value of being honest.  Patients will be guided to discuss their thoughts, feelings, and behaviors related to honesty and trusting in others. Patients will process together how trust and honesty relate to forming relationships with peers, family members, and self. Each patient will be challenged to identify and express feelings of being vulnerable. Patients will discuss reasons why people are dishonest and identify alternative outcomes if one was truthful (to self or others). This group will be process-oriented, with patients participating in exploration of their own experiences, giving and receiving support, and processing challenge from other group members.   Therapeutic Goals: 1. Patient will identify why honesty is important to relationships and how honesty overall affects relationships.  2. Patient will identify a situation where they lied or were lied too and the  feelings, thought process, and behaviors surrounding the situation 3. Patient will identify the meaning of being vulnerable, how that feels, and how that correlates to being honest with self and others. 4. Patient will identify situations where they could have told the truth, but instead lied and explain reasons of dishonesty.   Summary of Patient Progress : The patient was able to explore the value of being honest.  Patient discussed thoughts, feelings, and behaviors related to honesty and trusting in others. The patient processed together with other group members how trust and honesty relate to forming relationships with peers, family members, and self. Pt actively and appropriately engaged in the group. Patient was able to provide support and validation to other group members. Patient practiced active listening when  interacting with the facilitator and other group members.     Therapeutic Modalities:   Cognitive Behavioral Therapy Solution Focused Therapy Motivational Interviewing Brief Therapy  Kimbly Eanes  CUEBAS-COLON, LCSW 11/11/2018 12:37 PM

## 2018-11-11 NOTE — Progress Notes (Signed)
Patient appear calm and stable, expressed missing her family since she has been here with out them, patient takes her medications but said that when I take medicines , it fuzziest my discernment, if I speak it out  loud it gets distorted  To the hearer . And the enemy uses it against me. He also tries to take my voice when I'm speaking to another believer, all this happen when I have medicine in my body. Patient is given a lot of support and hope to over come her religious sentiments and her medications. .   Patient is eating well consuming more than 75% of her meals and drinking ensure supplements, well hydrated with fluids and juices, patient denies feeling suicidal , homicidal and denies experiencing hallucinations and delusions, express no concerns and no side effect from her medications.   Patient is tying socializing but very minimal , sleep is good , only requiring 15 minutes safety checks for safety no distress noted.Marland Kitchen

## 2018-11-12 LAB — GLUCOSE, CAPILLARY: Glucose-Capillary: 77 mg/dL (ref 70–99)

## 2018-11-12 MED ORDER — QUETIAPINE FUMARATE 25 MG PO TABS
25.0000 mg | ORAL_TABLET | Freq: Four times a day (QID) | ORAL | Status: DC | PRN
  Administered 2018-11-12 – 2018-11-14 (×4): 25 mg via ORAL
  Filled 2018-11-12 (×5): qty 1

## 2018-11-12 MED ORDER — QUETIAPINE FUMARATE 100 MG PO TABS
100.0000 mg | ORAL_TABLET | Freq: Every day | ORAL | Status: DC
  Administered 2018-11-12: 100 mg via ORAL
  Filled 2018-11-12: qty 1

## 2018-11-12 NOTE — Progress Notes (Signed)
Patient refused to talk to this writer as well as refused her medications. This Clinical research associate will notify MD.

## 2018-11-12 NOTE — Progress Notes (Addendum)
Patient asked this writer to obtain a set of vitals on her and her pulse and BP were slightly elevated: HR: 102, BP: 156/102, however, patient is very paranoid and has been tearful having off and on crying spells. This Clinical research associate informed MD of these findings.

## 2018-11-12 NOTE — Progress Notes (Signed)
[facility] MD Progress Note  11/12/2018 10:48 AM Ashley Griffin  MRN:  [id number] Subjective:  Patient seen.  Chart reviewed. Patient discussed with nursing; no overnight events reported.  Ashley Griffin is a 37yo CM without past psych history, who was admitted to inpatient psychiatry two days ago for medication management and stabilization secondary to agitation at home.    Today patient appears paranoid. She isolates self in the room, reads religious literature and journaling. She refused morning medications. She disclosed to me that she is afraid the medication will kill her. She is religiously-preoccupied and says she believes God will safe her. She denies suicidal or homicidal ideations. She reports feeling home-sick. 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 She stopped talking in the middle of interview and switch to writing short answers. She wrote that she wants her "husband to wake me up". She was encouraged to take medications to get better soon. Patient gave me permission to speak to her husband over the phone.  Spoke with patient's husband. He is very concerned about her condition. She had no past psych history. She is very organized and self-sufficient at baseline. They are religious family, but the patient become obsessively religiously-preoccupied suddenly last week. Husband will try to encourage the patient take medications.      Principal Problem: Brief reactive psychosis (HCC) Diagnosis: Principal Problem:   Brief reactive psychosis (HCC) Active Problems:   Depression   Anxiety state  Total Time spent with patient: 20 minutes  Past Psychiatric History: see admission H&P  Past Medical History: History reviewed. No pertinent past medical history. History reviewed. No pertinent surgical history. Family History: History reviewed. No pertinent family history. Family Psychiatric  History: see admission H&P Social History:   Social History   Substance and Sexual Activity  Alcohol Use Not Currently  . Frequency: Never     Social History   Substance and Sexual Activity  Drug Use Not on file    Social History   Socioeconomic History  . Marital status: Married    Spouse name: Not on file  . Number of children: Not on file  . Years of education: Not on file  . Highest education level: Not on file  Occupational History  . Not on file  Social Needs  . Financial resource strain: Not on file  . Food insecurity:    Worry: Not on file    Inability: Not on file  . Transportation needs:    Medical: Not on file    Non-medical: Not on file  Tobacco Use  . Smoking status: Never Smoker  . Smokeless tobacco: Never Used  Substance and Sexual Activity  . Alcohol use: Not Currently    Frequency: Never  . Drug use: Not on file  . Sexual activity: Not on file  Lifestyle  . Physical activity:    Days per week: Not on file    Minutes per session: Not on file  . Stress: Not on file  Relationships  . Social connections:    Talks on phone: Not on file    Gets together: Not on file    Attends religious service: Not on file    Active member of club or organization: Not on file    Attends meetings of clubs or organizations: Not on file    Relationship status: Not on file  Other Topics Concern  . Not on file  Social History Narrative  .  Not on file   Additional Social History:                         Sleep: Poor  Appetite:  Fair  Current Medications: Current Facility-Administered Medications  Medication Dose Route Frequency Provider Last Rate Last Dose  . acetaminophen (TYLENOL) tablet 650 mg  650 mg Oral Q6H PRN Catalina Gravel, NP      . alum & mag hydroxide-simeth (MAALOX/MYLANTA) 200-200-20 MG/5ML suspension 30 mL  30 mL Oral Q4H PRN Catalina Gravel, NP      . diltiazem (CARDIZEM) tablet 60 mg  60 mg Oral Q12H Thomspon, Adela Lank, NP   60 mg at 11/11/18 2102  . feeding  supplement (ENSURE ENLIVE) (ENSURE ENLIVE) liquid 237 mL  237 mL Oral BID BM Clapacs, John T, MD   237 mL at 11/11/18 1334  . hydrOXYzine (ATARAX/VISTARIL) tablet 25 mg  25 mg Oral Q6H PRN Catalina Gravel, NP   25 mg at 11/12/18 0355  . ibuprofen (ADVIL,MOTRIN) tablet 400 mg  400 mg Oral Q6H PRN Catalina Gravel, NP      . loratadine (CLARITIN) tablet 10 mg  10 mg Oral Daily Catalina Gravel, NP   10 mg at 11/11/18 0804  . LORazepam (ATIVAN) tablet 2 mg  2 mg Oral Q4H PRN Clapacs, Jackquline Denmark, MD   2 mg at 11/11/18 0421  . magnesium hydroxide (MILK OF MAGNESIA) suspension 30 mL  30 mL Oral Daily PRN Catalina Gravel, NP      . multivitamin with minerals tablet 1 tablet  1 tablet Oral Daily Clapacs, Jackquline Denmark, MD   1 tablet at 11/11/18 0804  . QUEtiapine (SEROQUEL) tablet 100 mg  100 mg Oral QHS PRN Clapacs, John T, MD      . sertraline (ZOLOFT) tablet 25 mg  25 mg Oral Daily Catalina Gravel, NP   25 mg at 11/11/18 0804  . traZODone (DESYREL) tablet 50 mg  50 mg Oral QHS Catalina Gravel, NP   50 mg at 11/11/18 2102    Lab Results: No results found for this or any previous visit (from the past 48 hour(s)).  Blood Alcohol level:  Lab Results  Component Value Date   ETH <10 11/09/2018    Metabolic Disorder Labs: No results found for: HGBA1C, MPG No results found for: PROLACTIN No results found for: CHOL, TRIG, HDL, CHOLHDL, VLDL, LDLCALC  Physical Findings: AIMS: Facial and Oral Movements Muscles of Facial Expression: None, normal Lips and Perioral Area: None, normal Jaw: None, normal Tongue: None, normal,Extremity Movements Upper (arms, wrists, hands, fingers): None, normal Lower (legs, knees, ankles, toes): None, normal, Trunk Movements Neck, shoulders, hips: None, normal, Overall Severity Severity of abnormal movements (highest score from questions above): None, normal Incapacitation due to abnormal movements: None, normal Patient's awareness of abnormal  movements (rate only patient's report): No Awareness, Dental Status Current problems with teeth and/or dentures?: No Does patient usually wear dentures?: No  CIWA:    COWS:  COWS Total Score: 6  Musculoskeletal: Strength & Muscle Tone: within normal limits Gait & Station: normal Patient leans: N/A  Psychiatric Specialty Exam: Physical Exam  ROS  Blood pressure (!) 147/91, pulse (!) 111, temperature 98.2 F (36.8 C), temperature source Oral, resp. rate 18, height 5\' 1"  (1.549 m), weight 68.9 kg, last menstrual period 11/09/2018, SpO2 100 %.Body mass index is 28.72 kg/m.  General Appearance: Guarded  Eye Contact:  Minimal  Speech:  Blocked  Volume:  Decreased  Mood:  Anxious and Dysphoric  Affect:  Constricted, Depressed and Inappropriate  Thought Process:  Disorganized  Orientation:  Other:  not oriented in day and time, oriented in self and place  Thought Content:  Illogical, Delusions, Obsessions, Paranoid Ideation and Rumination  Suicidal Thoughts:  No  Homicidal Thoughts:  No  Memory:  NA  Judgement:  Impaired  Insight:  Lacking  Psychomotor Activity:  Decreased  Concentration:  Concentration: Poor and Attention Span: Poor  Recall:  NA  Fund of Knowledge:  Poor  Language:  Fair  Akathisia:  No  Handed:  Right  AIMS (if indicated):     Assets:  Others:  N/A  ADL's:  Intact  Cognition:  Impaired,  Mild  Sleep:  Number of Hours: 2     Treatment Plan Summary:  Daily contact with patient to assess and evaluate symptoms and progress in treatment   Ms. Meece is a 37yo CM without past psych history, who was admitted to inpatient psychiatry two days ago for medication management and stabilization secondary to agitation at home.    Today patient refused morning medications (Zoloft and Cardizem). She is paranoid, religiously-preoccupied and appears internally-stimulated. She slept only 2 hours last night. She was encouraged to take medications. Spoke to patient's  husband, obtained collateral info (see above); he will also encourage the patient to take medications. Will switch QHS Seroquel from PRN to scheduled; will keep the current dose for now. Will continue other meds without changes.  Impression:  Brief psychotic disorder.  Plan: -continue inpatient psych admission; 15-minute checks; daily contact with patient to assess and evaluate symptoms and progress in treatment.  -continue Seroquel  PO QHS for psychosis;  -continue Zoloft  PO daily for anxiety  -continue Trazodone  PO QHS for sleep;  -continue Hydroxyzine  PO TID PRN anxiety;  -continue Ativan  PO Q4H PRN severe anxiety.  -psychoeducation;  -Disposition: to be determined.   Thalia Party, MD 11/12/2018, 10:48 AM

## 2018-11-12 NOTE — Progress Notes (Signed)
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 Patient sat in the dayroom with staff eating her lunch and then she came to the medication room and took her meds.

## 2018-11-12 NOTE — [facility] Group Notes (Signed)
LCSW Group Therapy Note 11/12/2018 1:15pm  Type of Therapy and Topic: Group Therapy: Feelings Around Returning Home & Establishing a Supportive Framework and Supporting Oneself When Supports Not Available  Participation Level: Did Not Attend  Description of Group:  Patients first processed thoughts and feelings about upcoming discharge. These included fears of upcoming changes, lack of change, new living environments, judgements and expectations from others and overall stigma of mental health issues. The group then discussed the definition of a supportive framework, what that looks and feels like, and how do to discern it from an unhealthy non-supportive network. The group identified different types of supports as well as what to do when your family/friends are less than helpful or unavailable  Therapeutic Goals  1. Patient will identify one healthy supportive network that they can use at discharge. 2. Patient will identify one factor of a supportive framework and how to tell it from an unhealthy network. 3. Patient able to identify one coping skill to use when they do not have positive supports from others. 4. Patient will demonstrate ability to communicate their needs through discussion and/or role plays.  Summary of Patient Progress:  Pt was invited to attend group but chose not to attend. CSW will continue to encourage pt to attend group throughout their admission.   Therapeutic Modalities Cognitive Behavioral Therapy Motivational Interviewing   Sophiea Ueda  CUEBAS-COLON, LCSW 11/12/2018 8:18 AM  

## 2018-11-12 NOTE — Plan of Care (Signed)
Medication is taken with out any side effects ,, contract for safety of self and others , sleep is continuous with out any interruptions, 15 minutes safety  checks is maintained.   Problem: Education: Goal: Utilization of techniques to improve thought processes will improve Outcome: Progressing Goal: Knowledge of the prescribed therapeutic regimen will improve Outcome: Progressing   Problem: Coping: Goal: Coping ability will improve Outcome: Progressing Goal: Will verbalize feelings Outcome: Progressing   Problem: Health Behavior/Discharge Planning: Goal: Identification of resources available to assist in meeting health care needs will improve Outcome: Progressing   Problem: Self-Concept: Goal: Will verbalize positive feelings about self Outcome: Progressing   Problem: Coping: Goal: Coping ability will improve Outcome: Progressing   Problem: Education: Goal: Knowledge of [place] General Education information/materials will improve Outcome: Progressing Goal: Emotional status will improve Outcome: Progressing Goal: Mental status will improve Outcome: Progressing Goal: Verbalization of understanding the information provided will improve Outcome: Progressing   Problem: Activity: Goal: Interest or engagement in activities will improve Outcome: Progressing Goal: Sleeping patterns will improve Outcome: Progressing   Problem: Coping: Goal: Ability to verbalize frustrations and anger appropriately will improve Outcome: Progressing Goal: Ability to demonstrate self-control will improve Outcome: Progressing   Problem: Health Behavior/Discharge Planning: Goal: Identification of resources available to assist in meeting health care needs will improve Outcome: Progressing Goal: Compliance with treatment plan for underlying cause of condition will improve Outcome: Progressing   Problem: Physical Regulation: Goal: Ability to maintain clinical measurements within normal  limits will improve Outcome: Progressing   Problem: Safety: Goal: Periods of time without injury will increase Outcome: Progressing

## 2018-11-12 NOTE — Progress Notes (Signed)
This Clinical research associate obtained a manual BP on patient, it was 142/98, in which it is still slightly elevated, but lower than the reading that was obtained earlier.

## 2018-11-12 NOTE — Plan of Care (Signed)
D- Patient alert and oriented. Patient presents in a religiously preoccupied, frightened, and paranoid mood on assessment reporting on her self-inventory that she slept good last night . Patient rated her depression and anxiety a "10/10" on her self-inventory, however, she didn't go into detail with this writer about why she reported this. Patient denies SI, HI, AVH, and pain at this time stating "not right now, but when I go to sleep that's when I see". Patient had no stated goals for today.  A- Scheduled medications administered to patient, per MD orders. Support and encouragement provided.  Routine safety checks conducted every 15 minutes.  Patient informed to notify staff with problems or concerns.  R- No adverse drug reactions noted. Patient contracts for safety at this time. Patient compliant with medications and treatment plan. Patient receptive, calm, and cooperative. Patient interacts well with others on the unit.  Patient remains safe at this time.  Problem: Education: Goal: Utilization of techniques to improve thought processes will improve Outcome: Not Progressing Goal: Knowledge of the prescribed therapeutic regimen will improve Outcome: Not Progressing   Problem: Coping: Goal: Coping ability will improve Outcome: Not Progressing Goal: Will verbalize feelings Outcome: Not Progressing   Problem: Health Behavior/Discharge Planning: Goal: Identification of resources available to assist in meeting health care needs will improve Outcome: Not Progressing   Problem: Self-Concept: Goal: Will verbalize positive feelings about self Outcome: Not Progressing   Problem: Coping: Goal: Coping ability will improve Outcome: Not Progressing   Problem: Education: Goal: Knowledge of [place] General Education information/materials will improve Outcome: Not Progressing Goal: Emotional status will improve Outcome: Not Progressing Goal: Mental status will improve Outcome: Not  Progressing Goal: Verbalization of understanding the information provided will improve Outcome: Not Progressing   Problem: Activity: Goal: Interest or engagement in activities will improve Outcome: Not Progressing Goal: Sleeping patterns will improve Outcome: Not Progressing   Problem: Coping: Goal: Ability to verbalize frustrations and anger appropriately will improve Outcome: Not Progressing Goal: Ability to demonstrate self-control will improve Outcome: Not Progressing   Problem: Health Behavior/Discharge Planning: Goal: Identification of resources available to assist in meeting health care needs will improve Outcome: Not Progressing Goal: Compliance with treatment plan for underlying cause of condition will improve Outcome: Not Progressing   Problem: Physical Regulation: Goal: Ability to maintain clinical measurements within normal limits will improve Outcome: Not Progressing   Problem: Safety: Goal: Periods of time without injury will increase Outcome: Not Progressing

## 2018-11-12 NOTE — Progress Notes (Signed)
Patient is responding to delusions of grandeur in religious preoccupation, tearful upon approach and exhibits nervousness  but can be easily redirected, isolate to her room most of the day, feeling depressed and sad, low energy and low self esteem, affect is appropriate to circumstance, support and   And encouragement is provided, to learn coping skills, medication is taken, patient endorse feeling depressed and anxious, denies suicidal and homicidal ideations , she denies any physical complains and no side effects from medications,. Patient verbally contract for safety of self and others, room is clear of any clutters , patient is safe in the unit and monitored every 15 minutes and frequently to maintain safety, no distress noted.

## 2018-11-13 DIAGNOSIS — F23 Brief psychotic disorder: Secondary | ICD-10-CM

## 2018-11-13 MED ORDER — QUETIAPINE FUMARATE 200 MG PO TABS
200.0000 mg | ORAL_TABLET | Freq: Every day | ORAL | Status: DC
  Administered 2018-11-13: 200 mg via ORAL
  Filled 2018-11-13: qty 1

## 2018-11-13 MED ORDER — LISINOPRIL 20 MG PO TABS
10.0000 mg | ORAL_TABLET | Freq: Every day | ORAL | Status: DC
  Administered 2018-11-13 – 2018-11-24 (×12): 10 mg via ORAL
  Filled 2018-11-13 (×12): qty 1

## 2018-11-13 NOTE — Progress Notes (Signed)
Recreation Therapy Notes   Date: 11/13/2018  Time: 9:30 am   Location: Craft room   Behavioral response: N/A   Intervention Topic: Goals  Discussion/Intervention: Patient did not attend group.   Clinical Observations/Feedback:  Patient did not attend group.   Karla Pavone LRT/CTRS        Kyra Laffey 11/13/2018 10:54 AM 

## 2018-11-13 NOTE — Progress Notes (Signed)
Patient's BP continues to run high. This Clinical research associate informed MD and he stated that he changed patient's BP medication, so staff will continue to monitor patient.

## 2018-11-13 NOTE — Progress Notes (Signed)
Patient's BP continues to be elevated even while taking medications. This Clinical research associate will notify MD.

## 2018-11-13 NOTE — Progress Notes (Signed)
[facility] MD Progress Note  11/13/2018 5:20 PM Ashley Griffin  MRN:  [id number] Subjective: Patient seen chart reviewed.  Patient who has a new onset atypical psychosis.  Much more agitated today.  Patient is delusional and paranoid.  Labile poor insight.  No clear side effects to medication however.  Slept slightly better. Principal Problem: Brief reactive psychosis (HCC) Diagnosis: Principal Problem:   Brief reactive psychosis (HCC) Active Problems:   Depression   Anxiety state  Total Time spent with patient: 30 minutes  Past Psychiatric History: No previous history  Past Medical History: History reviewed. No pertinent past medical history. History reviewed. No pertinent surgical history. Family History: History reviewed. No pertinent family history. Family Psychiatric  History: None Social History:  Social History   Substance and Sexual Activity  Alcohol Use Not Currently  . Frequency: Never     Social History   Substance and Sexual Activity  Drug Use Not on file    Social History   Socioeconomic History  . Marital status: Married    Spouse name: Not on file  . Number of children: Not on file  . Years of education: Not on file  . Highest education level: Not on file  Occupational History  . Not on file  Social Needs  . Financial resource strain: Not on file  . Food insecurity:    Worry: Not on file    Inability: Not on file  . Transportation needs:    Medical: Not on file    Non-medical: Not on file  Tobacco Use  . Smoking status: Never Smoker  . Smokeless tobacco: Never Used  Substance and Sexual Activity  . Alcohol use: Not Currently    Frequency: Never  . Drug use: Not on file  . Sexual activity: Not on file  Lifestyle  . Physical activity:    Days per week: Not on file    Minutes per session: Not on file  . Stress: Not on file  Relationships  . Social connections:    Talks on phone: Not on file    Gets together: Not on file    Attends religious  service: Not on file    Active member of club or organization: Not on file    Attends meetings of clubs or organizations: Not on file    Relationship status: Not on file  Other Topics Concern  . Not on file  Social History Narrative  . Not on file   Additional Social History:                         Sleep: Poor  Appetite:  Fair  Current Medications: Current Facility-Administered Medications  Medication Dose Route Frequency Provider Last Rate Last Dose  . acetaminophen (TYLENOL) tablet 650 mg  650 mg Oral Q6H PRN Catalina Gravel, NP      . alum & mag hydroxide-simeth (MAALOX/MYLANTA) 200-200-20 MG/5ML suspension 30 mL  30 mL Oral Q4H PRN Thomspon, Adela Lank, NP      . feeding supplement (ENSURE ENLIVE) (ENSURE ENLIVE) liquid 237 mL  237 mL Oral BID BM Jazlen Ogarro T, MD   237 mL at 11/13/18 1500  . hydrOXYzine (ATARAX/VISTARIL) tablet 25 mg  25 mg Oral Q6H PRN Catalina Gravel, NP   25 mg at 11/12/18 1514  . ibuprofen (ADVIL,MOTRIN) tablet 400 mg  400 mg Oral Q6H PRN Catalina Gravel, NP      . lisinopril (PRINIVIL,ZESTRIL) tablet 10 mg  10 mg Oral  Daily Lyon Dumont, Jackquline Denmark, MD   10 mg at 11/13/18 1130  . loratadine (CLARITIN) tablet 10 mg  10 mg Oral Daily Catalina Gravel, NP   10 mg at 11/13/18 0802  . LORazepam (ATIVAN) tablet 2 mg  2 mg Oral Q4H PRN Cristol Engdahl, Jackquline Denmark, MD   2 mg at 11/13/18 1559  . magnesium hydroxide (MILK OF MAGNESIA) suspension 30 mL  30 mL Oral Daily PRN Catalina Gravel, NP      . multivitamin with minerals tablet 1 tablet  1 tablet Oral Daily Todrick Siedschlag, Jackquline Denmark, MD   1 tablet at 11/13/18 0802  . QUEtiapine (SEROQUEL) tablet 200 mg  200 mg Oral QHS Jarmar Rousseau T, MD      . QUEtiapine (SEROQUEL) tablet 25 mg  25 mg Oral Q6H PRN Thalia Party, MD   25 mg at 11/13/18 1559  . sertraline (ZOLOFT) tablet 25 mg  25 mg Oral Daily Catalina Gravel, NP   25 mg at 11/13/18 0802  . traZODone (DESYREL) tablet 50 mg  50 mg Oral QHS Catalina Gravel, NP   50 mg at 11/12/18 2105    Lab Results:  Results for orders placed or performed during the hospital encounter of 11/09/18 (from the past 48 hour(s))  Glucose, capillary     Status: None   Collection Time: 11/12/18  3:50 PM  Result Value Ref Range   Glucose-Capillary 77 70 - 99 mg/dL    Blood Alcohol level:  Lab Results  Component Value Date   ETH <10 11/09/2018    Metabolic Disorder Labs: No results found for: HGBA1C, MPG No results found for: PROLACTIN No results found for: CHOL, TRIG, HDL, CHOLHDL, VLDL, LDLCALC  Physical Findings: AIMS: Facial and Oral Movements Muscles of Facial Expression: None, normal Lips and Perioral Area: None, normal Jaw: None, normal Tongue: None, normal,Extremity Movements Upper (arms, wrists, hands, fingers): None, normal Lower (legs, knees, ankles, toes): None, normal, Trunk Movements Neck, shoulders, hips: None, normal, Overall Severity Severity of abnormal movements (highest score from questions above): None, normal Incapacitation due to abnormal movements: None, normal Patient's awareness of abnormal movements (rate only patient's report): No Awareness, Dental Status Current problems with teeth and/or dentures?: No Does patient usually wear dentures?: No  CIWA:    COWS:  COWS Total Score: 6  Musculoskeletal: Strength & Muscle Tone: within normal limits Gait & Station: normal Patient leans: N/A  Psychiatric Specialty Exam: Physical Exam  Nursing note and vitals reviewed. Constitutional: She appears well-developed and well-nourished.  HENT:  Head: Normocephalic and atraumatic.  Eyes: Pupils are equal, round, and reactive to light. Conjunctivae are normal.  Neck: Normal range of motion.  Cardiovascular: Regular rhythm and normal heart sounds.  Respiratory: Effort normal. No respiratory distress.  GI: Soft.  Musculoskeletal: Normal range of motion.  Neurological: She is alert.  Skin: Skin is warm and dry.   Psychiatric: Her mood appears anxious. Her affect is labile. Her speech is tangential. She is agitated. She is not aggressive. Thought content is paranoid and delusional. Cognition and memory are impaired. She expresses impulsivity and inappropriate judgment. She expresses no homicidal and no suicidal ideation. She is noncommunicative.    Review of Systems  Constitutional: Negative.   HENT: Negative.   Eyes: Negative.   Respiratory: Negative.   Cardiovascular: Negative.   Gastrointestinal: Negative.   Musculoskeletal: Negative.   Skin: Negative.   Neurological: Negative.   Psychiatric/Behavioral: Positive for depression and hallucinations. Negative for memory loss, substance abuse and suicidal ideas.  The patient is nervous/anxious and has insomnia.     Blood pressure (!) 158/103, pulse 98, temperature 98.1 F (36.7 C), temperature source Oral, resp. rate 16, height 5\' 1"  (1.549 m), weight 68.9 kg, last menstrual period 11/09/2018, SpO2 100 %.Body mass index is 28.72 kg/m.  General Appearance: Disheveled  Eye Contact:  Fair  Speech:  Garbled and Slow  Volume:  Decreased  Mood:  Anxious, Dysphoric and Irritable  Affect:  Inappropriate and Labile  Thought Process:  Disorganized  Orientation:  NA  Thought Content:  Illogical, Delusions, Hallucinations: Auditory, Paranoid Ideation and Rumination  Suicidal Thoughts:  No  Homicidal Thoughts:  No  Memory:  Immediate;   Fair Recent;   Poor Remote;   Fair  Judgement:  Impaired  Insight:  Lacking  Psychomotor Activity:  Restlessness  Concentration:  Concentration: Poor  Recall:  Poor  Fund of Knowledge:  Good  Language:  Fair  Akathisia:  No  Handed:  Right  AIMS (if indicated):     Assets:  Communication Skills Desire for Improvement Physical Health Resilience Social Support  ADL's:  Impaired  Cognition:  Impaired,  Mild  Sleep:  Number of Hours: 5.15     Treatment Plan Summary: Daily contact with patient to assess and  evaluate symptoms and progress in treatment, Medication management and Plan Patient seen chart reviewed.  Patient is much more agitated today.  Increase Seroquel to 200 mg at night.  Continue low-dose Zoloft.  Spent a lot of time reassuring her today and also spoke to her husband on the telephone tonight and reassured and gave psychoeducation to him.  Nursing suggested we get a one-to-one sitter for her because of her agitation and paranoia and so that has been done for now as well.  Mordecai Rasmussen, MD 11/13/2018, 5:20 PM

## 2018-11-13 NOTE — Progress Notes (Signed)
1:1 Patient Hourly Rounding   1100: Patient is in her room with her assigned safety sitter present at bedside.  1500: Patient continues to be sitting in her room with her assigned safety sitter present at bedside.  1900: Patient is on the phone with her assigned safety sitter present.

## 2018-11-13 NOTE — Plan of Care (Signed)
D- Patient alert and oriented. Patient continues to present in a religiously preoccupied, frightened, and paranoid moos on assessment. Patient denies SI, HI, AVH, and pain at this time. Patient didn't endorse any depression or anxiety to this writer, however, she is sad and has been having crying spells throughout the day and has stated that she misses her husband and children. Patient had no stated goals for today.  A- Scheduled medications administered to patient, per MD orders. Support and encouragement provided.  Routine safety checks conducted every 15 minutes.  Patient informed to notify staff with problems or concerns.  R- No adverse drug reactions noted. Patient contracts for safety at this time. Patient compliant with medications and treatment plan. Patient receptive, calm, and cooperative. Patient interacts well with others on the unit.  Patient remains safe at this time.   Problem: Education: Goal: Utilization of techniques to improve thought processes will improve Outcome: Not Progressing Goal: Knowledge of the prescribed therapeutic regimen will improve Outcome: Not Progressing   Problem: Coping: Goal: Coping ability will improve Outcome: Not Progressing Goal: Will verbalize feelings Outcome: Not Progressing   Problem: Health Behavior/Discharge Planning: Goal: Identification of resources available to assist in meeting health care needs will improve Outcome: Not Progressing   Problem: Self-Concept: Goal: Will verbalize positive feelings about self Outcome: Not Progressing   Problem: Coping: Goal: Coping ability will improve Outcome: Not Progressing   Problem: Education: Goal: Knowledge of [place] General Education information/materials will improve Outcome: Not Progressing Goal: Emotional status will improve Outcome: Not Progressing Goal: Mental status will improve Outcome: Not Progressing Goal: Verbalization of understanding the information provided will  improve Outcome: Not Progressing   Problem: Activity: Goal: Interest or engagement in activities will improve Outcome: Not Progressing Goal: Sleeping patterns will improve Outcome: Not Progressing   Problem: Coping: Goal: Ability to verbalize frustrations and anger appropriately will improve Outcome: Not Progressing Goal: Ability to demonstrate self-control will improve Outcome: Not Progressing   Problem: Health Behavior/Discharge Planning: Goal: Identification of resources available to assist in meeting health care needs will improve Outcome: Not Progressing Goal: Compliance with treatment plan for underlying cause of condition will improve Outcome: Not Progressing   Problem: Physical Regulation: Goal: Ability to maintain clinical measurements within normal limits will improve Outcome: Not Progressing   Problem: Safety: Goal: Periods of time without injury will increase Outcome: Not Progressing

## 2018-11-13 NOTE — Progress Notes (Signed)
Patient  Appear calm report feeling sleepy after taking her PM meds. 1&1 is continued for safety noted.

## 2018-11-13 NOTE — [facility] Group Notes (Signed)
LCSW Group Therapy Note   11/13/2018 1:00 PM  Type of Therapy and Topic:  Group Therapy:  Overcoming Obstacles   Participation Level:  Did Not Attend   Description of Group:    In this group patients will be encouraged to explore what they see as obstacles to their own wellness and recovery. They will be guided to discuss their thoughts, feelings, and behaviors related to these obstacles. The group will process together ways to cope with barriers, with attention given to specific choices patients can make. Each patient will be challenged to identify changes they are motivated to make in order to overcome their obstacles. This group will be process-oriented, with patients participating in exploration of their own experiences as well as giving and receiving support and challenge from other group members.   Therapeutic Goals: 1. Patient will identify personal and current obstacles as they relate to admission. 2. Patient will identify barriers that currently interfere with their wellness or overcoming obstacles.  3. Patient will identify feelings, thought process and behaviors related to these barriers. 4. Patient will identify two changes they are willing to make to overcome these obstacles:      Summary of Patient Progress X      Therapeutic Modalities:   Cognitive Behavioral Therapy Solution Focused Therapy Motivational Interviewing Relapse Prevention Therapy  Rayson Rando, MSW, LCSW 11/13/2018 12:53 PM    

## 2018-11-13 NOTE — [facility] Suicide Risk Assessment (Signed)
[facility] INPATIENT:  Family/Significant Other Suicide Prevention Education  Suicide Prevention Education:  Education Completed; Ronalyn Kintner, husband, [phone] has been identified by the patient as the family member/significant other with whom the patient will be residing, and identified as the person(s) who will aid the patient in the event of a mental health crisis (suicidal ideations/suicide attempt).  With written consent from the patient, the family member/significant other has been provided the following suicide prevention education, prior to the and/or following the discharge of the patient.  The suicide prevention education provided includes the following:  Suicide risk factors  Suicide prevention and interventions  National Suicide Hotline telephone number  [facility] assessment telephone number  [facility] Emergency Assistance 911  [facility] and/or Residential Mobile Crisis Unit telephone number  Request made of family/significant other to:  Remove weapons (e.g., guns, rifles, knives), all items previously/currently identified as safety concern.    Remove drugs/medications (over-the-counter, prescriptions, illicit drugs), all items previously/currently identified as a safety concern.  The family member/significant other verbalizes understanding of the suicide prevention education information provided.  The family member/significant other agrees to remove the items of safety concern listed above.  Harden Mo 11/13/2018, 10:42 AM

## 2018-11-13 NOTE — [facility] Counselor (Signed)
CSW spoke with the patients husband who expressed concerns that the patient's current medications were having an adverse effect because of supplements "essential oils, bio-drops, probiotics" that the patient is taking.   [name], MSW, LCSW 11/13/2018 3:45 PM

## 2018-11-13 NOTE — Progress Notes (Signed)
Blood pressure this morning is 146/102 , Cardizem  60 mg P.O , is administered,  Patient is stable no symptoms noted at this time , will recheck BP again.

## 2018-11-13 NOTE — Progress Notes (Signed)
Patient was provided with a specimen cup to provide a urine sample. Patient stated that she would bring the sample back when she could.

## 2018-11-13 NOTE — Progress Notes (Signed)
Patient is on 1 to 1 ordered for safety of patient and other residents, patient is complying with redirections and maintaining safety no clutter in the  room and no distress.

## 2018-11-14 LAB — URINALYSIS, COMPLETE (UACMP) WITH MICROSCOPIC
Bacteria, UA: NONE SEEN
Bilirubin Urine: NEGATIVE
Glucose, UA: NEGATIVE mg/dL
Hgb urine dipstick: NEGATIVE
KETONES UR: NEGATIVE mg/dL
Leukocytes,Ua: NEGATIVE
Nitrite: NEGATIVE
Protein, ur: NEGATIVE mg/dL
Specific Gravity, Urine: 1.014 (ref 1.005–1.030)
pH: 7 (ref 5.0–8.0)

## 2018-11-14 LAB — TSH: TSH: 4.625 u[IU]/mL — ABNORMAL HIGH (ref 0.350–4.500)

## 2018-11-14 MED ORDER — RISPERIDONE 1 MG PO TBDP
1.0000 mg | ORAL_TABLET | Freq: Four times a day (QID) | ORAL | Status: DC | PRN
  Administered 2018-11-14 – 2018-11-29 (×9): 1 mg via ORAL
  Filled 2018-11-14 (×12): qty 1

## 2018-11-14 MED ORDER — DIPHENHYDRAMINE HCL 50 MG/ML IJ SOLN
50.0000 mg | Freq: Once | INTRAMUSCULAR | Status: AC
  Administered 2018-11-14: 50 mg via INTRAMUSCULAR
  Filled 2018-11-14: qty 1

## 2018-11-14 MED ORDER — HALOPERIDOL LACTATE 5 MG/ML IJ SOLN
5.0000 mg | Freq: Once | INTRAMUSCULAR | Status: AC
  Administered 2018-11-14: 5 mg via INTRAMUSCULAR
  Filled 2018-11-14: qty 1

## 2018-11-14 MED ORDER — HALOPERIDOL DECANOATE 100 MG/ML IM SOLN: 5.0000 mg | Freq: Once | INTRAMUSCULAR | Status: DC

## 2018-11-14 MED ORDER — LORAZEPAM 2 MG/ML IJ SOLN
2.0000 mg | Freq: Once | INTRAMUSCULAR | Status: AC
  Administered 2018-11-14: 2 mg via INTRAMUSCULAR
  Filled 2018-11-14: qty 1

## 2018-11-14 MED ORDER — QUETIAPINE FUMARATE 200 MG PO TABS
300.0000 mg | ORAL_TABLET | Freq: Every day | ORAL | Status: DC
  Administered 2018-11-14: 300 mg via ORAL
  Filled 2018-11-14: qty 1

## 2018-11-14 NOTE — Progress Notes (Signed)
Patient observed in the day room with staff present severely disorganized in her thought process, crying, and visibly anxious. Will give additional PRN medication for comfort and safety. Will continue to monitor 1:1 for safety.

## 2018-11-14 NOTE — BHH Group Notes (Signed)
Feelings Around Diagnosis 11/14/2018 1PM  Type of Therapy/Topic:  Group Therapy:  Feelings about Diagnosis  Participation Level:  None   Description of Group:   This group will allow patients to explore their thoughts and feelings about diagnoses they have received. Patients will be guided to explore their level of understanding and acceptance of these diagnoses. Facilitator will encourage patients to process their thoughts and feelings about the reactions of others to their diagnosis and will guide patients in identifying ways to discuss their diagnosis with significant others in their lives. This group will be process-oriented, with patients participating in exploration of their own experiences, giving and receiving support, and processing challenge from other group members.   Therapeutic Goals: 1. Patient will demonstrate understanding of diagnosis as evidenced by identifying two or more symptoms of the disorder 2. Patient will be able to express two feelings regarding the diagnosis 3. Patient will demonstrate their ability to communicate their needs through discussion and/or role play  Summary of Patient Progress:  Pt came to the group has 1:1 restriction. While in group, pt got up and began talking to a door and referring to the door as "Roger." Pt was defiant toward mental health tech. Pt left group early and did not return.     Therapeutic Modalities:   Cognitive Behavioral Therapy Brief Therapy Feelings Identification    Suzan Slick, LCSW 11/14/2018 2:30 PM

## 2018-11-14 NOTE — Progress Notes (Signed)
Patient is in bed with eyes closed stable no distress 1&1 continued

## 2018-11-14 NOTE — Progress Notes (Signed)
Patient placed on back hallway for safety with 1:1 and security. Pt. Is walking into other patients rooms confused, disorganized, and paranoid.

## 2018-11-14 NOTE — Progress Notes (Signed)
D: Pt this morning at first during assessments would just stare at this writer, but as the morning progressed, she began to speak much more frequently, but disorganized in her thought process. Pt. Is hyper-religous, paranoid, and her mood is very labile. Pt. Observed crying one moment and euphoric another.   A: Q x 1 hour observation checks to be completed for safety. Patient was provided with education, but shows no signs of learning. Patient was given/offered medications per orders. Patient  was encourage to attend groups, participate in unit activities and continue with plan of care. Pt. Chart and plans of care reviewed. Pt. Given support and encouragement.   R: Patient is complaint with medications with lots of direction and encouragement. Pt. This morning up and down the hallways, pacing, and or standing still starring blankly around the unit. Pt. Eating good, observed able to eat effectively still. Pt. Remains on 1:1 for safety due to labile mood and behaviors.

## 2018-11-14 NOTE — Progress Notes (Signed)
Patient visibly anxious observed running down the hallway by nursing station with 1:1 present. Will give PRN medication for comfort and safety. Will continue to monitor.

## 2018-11-14 NOTE — Progress Notes (Signed)
EKG hand delivered to MD and then placed on patient chart for additional reviewing.

## 2018-11-14 NOTE — Progress Notes (Signed)
Patient following direction better from staff. Will allow patient to go outside with 1:1 observation BHT, additional BHT, and security present.

## 2018-11-14 NOTE — BHH Counselor (Signed)
CSW spoke with [facility], who report that with the patient's age and gender they are at capacity.  CSW inquired about the different behavioral health units per the husband, CSW was informed that they did have several different units.  CSW asked if pt would be appropriate for the postpartum unit and was informed that patient would not.    CSW was informed that to identify bed availability, CSW would need to call daily to check.  [name], MSW, LCSW 11/14/2018 3:23 PM

## 2018-11-14 NOTE — Progress Notes (Signed)
1:1 Observation Note  0700 Pt. Observed running down the hallway randomly out of her room to the day room with 1:1 present for safety. Pt. Visibly anxious. PRN medications given for comfort and safety.  0800 Pt. Observed with 1:1 present in the day room eating breakfast. Pt. Presentation still bizarre, but calmer.  0900 Pt. Observed with 1:1 present for safety in the day room. Pt. Not interacting much with others.  1000 Pt. Is observed in the hallway, visibly agitated, anxious, and disorganized in her thought process. Pt. Is with 1:1 for safety. PRN medications being given per MD for safety and comfort.  1100 Pt. Observed in the hallway with 1:1 present for safety. Pt. Is still confused and disorganized in her though process. Will continue to monitor for safety.  1200 Pt. Visibly agitated and disorganized in her room yelling. Pt. With 1:1 for safety. Pt. Is not aggressive with staff, but agitation clearly present.  1300 Pt. Observed in the hallway with 1:1 present for safety. Pt. Is less agitated and anxious, but continues to be disorganized. Will continue to monitor for safety.  1400 Patient placed on back hallway for safety with 1:1 and security. Pt. Is walking into other patients rooms confused, disorganized, and paranoid.  1500 Patient is much calmer on the back hallway with this Clinical research associate. Pt. Is still disorganized, but not crying or agitated currently.  1600 Pt. Observed in her room resting with 1:1 present for safety. No distress noted.  1700 Pt. Observed in her room resting with 1:1 present for safety. No distress noted. 1800 Pt. In room with this Clinical research associate and additional female RN completing EKG at this time. Pt. Is more calm, but very disorganized still.

## 2018-11-14 NOTE — Progress Notes (Signed)
Patient at this time is visibly agitated, having crying spells, and severely disorganized in her thought process. The patient currently is hyper-religous, paranoid, and confused. Pt. Given PRN medications for comfort and safety. Pt. Remains on 1:1 for safety. Will continue to monitor for safety.

## 2018-11-14 NOTE — Progress Notes (Signed)
[facility] MD Progress Note  11/14/2018 4:49 PM Ashley Griffin  MRN:  [id number] Subjective: Follow-up for this woman with new onset psychosis.  Patient seen chart reviewed.  Patient remains very disorganized and agitated today.  I attempted to communicate with her and she was not able to answer even simple questions.  Everything I ask her resulted in her becoming hyper religious and talking about things from the Old Testament and ways that had nothing to do with the current situation.  Affect labile confused irritable.  Sometimes some very inappropriate behavior.  Running down the hall shouting.  Not able to be controlled by one-to-one nursing.  No sign of any side effects from medicine.  Does not appear to have akathisia. Principal Problem: Brief reactive psychosis (HCC) Diagnosis: Principal Problem:   Brief reactive psychosis (HCC) Active Problems:   Depression   Anxiety state  Total Time spent with patient: 30 minutes  Past Psychiatric History: Patient has no significant past psychiatric history  Past Medical History: History reviewed. No pertinent past medical history. History reviewed. No pertinent surgical history. Family History: History reviewed. No pertinent family history. Family Psychiatric  History: None known Social History:  Social History   Substance and Sexual Activity  Alcohol Use Not Currently  . Frequency: Never     Social History   Substance and Sexual Activity  Drug Use Not on file    Social History   Socioeconomic History  . Marital status: Married    Spouse name: Not on file  . Number of children: Not on file  . Years of education: Not on file  . Highest education level: Not on file  Occupational History  . Not on file  Social Needs  . Financial resource strain: Not on file  . Food insecurity:    Worry: Not on file    Inability: Not on file  . Transportation needs:    Medical: Not on file    Non-medical: Not on file  Tobacco Use  . Smoking status:  Never Smoker  . Smokeless tobacco: Never Used  Substance and Sexual Activity  . Alcohol use: Not Currently    Frequency: Never  . Drug use: Not on file  . Sexual activity: Not on file  Lifestyle  . Physical activity:    Days per week: Not on file    Minutes per session: Not on file  . Stress: Not on file  Relationships  . Social connections:    Talks on phone: Not on file    Gets together: Not on file    Attends religious service: Not on file    Active member of club or organization: Not on file    Attends meetings of clubs or organizations: Not on file    Relationship status: Not on file  Other Topics Concern  . Not on file  Social History Narrative  . Not on file   Additional Social History:                         Sleep: Fair  Appetite:  Fair  Current Medications: Current Facility-Administered Medications  Medication Dose Route Frequency Provider Last Rate Last Dose  . acetaminophen (TYLENOL) tablet 650 mg  650 mg Oral Q6H PRN Catalina Gravel, NP      . alum & mag hydroxide-simeth (MAALOX/MYLANTA) 200-200-20 MG/5ML suspension 30 mL  30 mL Oral Q4H PRN Catalina Gravel, NP      . feeding supplement (ENSURE ENLIVE) (ENSURE  ENLIVE) liquid 237 mL  237 mL Oral BID BM Lovenia Debruler T, MD   237 mL at 11/14/18 1346  . hydrOXYzine (ATARAX/VISTARIL) tablet 25 mg  25 mg Oral Q6H PRN Catalina Gravel, NP   25 mg at 11/14/18 0940  . ibuprofen (ADVIL,MOTRIN) tablet 400 mg  400 mg Oral Q6H PRN Catalina Gravel, NP      . lisinopril (PRINIVIL,ZESTRIL) tablet 10 mg  10 mg Oral Daily Slevin Gunby, Jackquline Denmark, MD   10 mg at 11/14/18 0719  . loratadine (CLARITIN) tablet 10 mg  10 mg Oral Daily Catalina Gravel, NP   10 mg at 11/14/18 0719  . LORazepam (ATIVAN) tablet 2 mg  2 mg Oral Q4H PRN Fahmida Jurich, Jackquline Denmark, MD   2 mg at 11/14/18 1148  . magnesium hydroxide (MILK OF MAGNESIA) suspension 30 mL  30 mL Oral Daily PRN Catalina Gravel, NP      . multivitamin with  minerals tablet 1 tablet  1 tablet Oral Daily Jaeceon Michelin, Jackquline Denmark, MD   1 tablet at 11/14/18 0719  . QUEtiapine (SEROQUEL) tablet 200 mg  200 mg Oral QHS Altin Sease T, MD   200 mg at 11/13/18 2140  . QUEtiapine (SEROQUEL) tablet 25 mg  25 mg Oral Q6H PRN Thalia Party, MD   25 mg at 11/14/18 0940  . risperiDONE (RISPERDAL M-TABS) disintegrating tablet 1 mg  1 mg Oral Q6H PRN Raeana Blinn, Jackquline Denmark, MD   1 mg at 11/14/18 1022  . sertraline (ZOLOFT) tablet 25 mg  25 mg Oral Daily Catalina Gravel, NP   25 mg at 11/14/18 0719  . traZODone (DESYREL) tablet 50 mg  50 mg Oral QHS Catalina Gravel, NP   50 mg at 11/13/18 2200    Lab Results: No results found for this or any previous visit (from the past 48 hour(s)).  Blood Alcohol level:  Lab Results  Component Value Date   ETH <10 11/09/2018    Metabolic Disorder Labs: No results found for: HGBA1C, MPG No results found for: PROLACTIN No results found for: CHOL, TRIG, HDL, CHOLHDL, VLDL, LDLCALC  Physical Findings: AIMS: Facial and Oral Movements Muscles of Facial Expression: None, normal Lips and Perioral Area: None, normal Jaw: None, normal Tongue: None, normal,Extremity Movements Upper (arms, wrists, hands, fingers): None, normal Lower (legs, knees, ankles, toes): None, normal, Trunk Movements Neck, shoulders, hips: None, normal, Overall Severity Severity of abnormal movements (highest score from questions above): None, normal Incapacitation due to abnormal movements: None, normal Patient's awareness of abnormal movements (rate only patient's report): No Awareness, Dental Status Current problems with teeth and/or dentures?: No Does patient usually wear dentures?: No  CIWA:    COWS:  COWS Total Score: 6  Musculoskeletal: Strength & Muscle Tone: within normal limits Gait & Station: normal Patient leans: N/A  Psychiatric Specialty Exam: Physical Exam  Nursing note and vitals reviewed. Constitutional: She appears well-developed  and well-nourished.  HENT:  Head: Normocephalic and atraumatic.  Eyes: Pupils are equal, round, and reactive to light. Conjunctivae are normal.  Neck: Normal range of motion.  Cardiovascular: Regular rhythm and normal heart sounds.  Respiratory: Effort normal. No respiratory distress.  GI: Soft.  Musculoskeletal: Normal range of motion.  Neurological: She is alert.  Skin: Skin is warm and dry.  Psychiatric: Her mood appears anxious. Her affect is labile and inappropriate. Her speech is tangential. She is agitated and hyperactive. She is not aggressive. Thought content is paranoid and delusional. Cognition and memory are impaired. She expresses impulsivity.  She is noncommunicative.    Review of Systems  Constitutional: Negative.   HENT: Negative.   Eyes: Negative.   Respiratory: Negative.   Cardiovascular: Negative.   Gastrointestinal: Negative.   Musculoskeletal: Negative.   Skin: Negative.   Neurological: Negative.   Psychiatric/Behavioral: Positive for hallucinations and memory loss. Negative for depression, substance abuse and suicidal ideas. The patient is nervous/anxious and has insomnia.     Blood pressure (!) 142/95, pulse (!) 110, temperature 98.1 F (36.7 C), temperature source Oral, resp. rate 16, height 5\' 1"  (1.549 m), weight 68.9 kg, last menstrual period 11/09/2018, SpO2 99 %.Body mass index is 28.72 kg/m.  General Appearance: Disheveled  Eye Contact:  Minimal  Speech:  Blocked  Volume:  Decreased  Mood:  Anxious and Irritable  Affect:  Inappropriate and Labile  Thought Process:  Disorganized  Orientation:  Negative  Thought Content:  Illogical, Delusions and Hallucinations: Auditory Visual  Suicidal Thoughts:  No  Homicidal Thoughts:  No  Memory:  Immediate;   Fair Recent;   Poor Remote;   Poor  Judgement:  Impaired  Insight:  Lacking  Psychomotor Activity:  Restlessness  Concentration:  Concentration: Poor  Recall:  Poor  Fund of Knowledge:  Poor   Language:  Fair  Akathisia:  Negative  Handed:  Right  AIMS (if indicated):     Assets:  Housing Physical Health Resilience Social Support  ADL's:  Impaired  Cognition:  Impaired,  Mild  Sleep:  Number of Hours: 4.75     Treatment Plan Summary: Daily contact with patient to assess and evaluate symptoms and progress in treatment, Medication management and Plan Patient remains agitated confused psychotic.  We have increased PRN medicines for psychosis as well as anxiety with only partial response.  Nursing has chosen to keep the patient down in the back hallway because of potential for disruption to others on the unit.  I will speak to her husband again today.  We are increasing the dose of Seroquel again.  I noted in the chart that no TSH was done on admission and we will need to get that done as well as a urine analysis.  Mordecai Rasmussen, MD 11/14/2018, 4:49 PM

## 2018-11-14 NOTE — Progress Notes (Signed)
Recreation Therapy Notes    Date: 11/14/2018  Time: 9:30 am   Location: Craft room   Behavioral response: N/A   Intervention Topic: Values  Discussion/Intervention: Patient did not attend group.   Clinical Observations/Feedback:  Patient did not attend group.   Joshuajames Moehring LRT/CTRS      Daryl Beehler 11/14/2018 10:52 AM

## 2018-11-14 NOTE — Progress Notes (Signed)
Patient in bed with eyes closed , stable no distress 1&1 continued for safety.

## 2018-11-14 NOTE — Plan of Care (Signed)
Pt. Has been compliant with treatment and medications with direction and encouragement. Pt. Has been able to remain safe. Pt. Is on 1:1 observations for safety. Pt. Mental status continues to present severely disorganized in her thought process and with confusion. Pt. Mood is very labile with frequent crying spells.    Problem: Education: Goal: Emotional status will improve Outcome: Not Progressing Goal: Mental status will improve Outcome: Not Progressing   Problem: Health Behavior/Discharge Planning: Goal: Compliance with treatment plan for underlying cause of condition will improve Outcome: Progressing   Problem: Safety: Goal: Periods of time without injury will increase Outcome: Progressing

## 2018-11-14 NOTE — Progress Notes (Signed)
Patient instructed on need for UA sampling per MD orders. Pt. Looks at Conservation officer, nature, but no response is given. Pt. Just starring with blank and paranoid expression.

## 2018-11-14 NOTE — BHH Counselor (Signed)
CSW spoke with the patient's husband to inform of the information from [facility].  Patient husband asked that CSw call daily to check on bed availability at [facility].  CSW informed that she would, however, pointed out that [facility] has indicated that they will review the case and pt may not be accepted by their unit.    [name], MSW, LCSW 11/14/2018 3:33 PM

## 2018-11-14 NOTE — BHH Counselor (Signed)
CSW spoke with the patient's husband who reports that he wants to have the patient transferred to [facility].  Husband reports that he wants the transfer due to a promise he and the patient made to each other that this would be the hospital of their choice and he is concerned to the patient to doctor ration on current unit.  CSW informed patient that she would look into the transfer and follow up with the patients husband.    [name], MSW, LCSW 11/14/2018 2:17 PM

## 2018-11-15 MED ORDER — QUETIAPINE FUMARATE 200 MG PO TABS
400.0000 mg | ORAL_TABLET | Freq: Every day | ORAL | Status: DC
  Administered 2018-11-15: 400 mg via ORAL
  Administered 2018-11-16: 200 mg via ORAL
  Administered 2018-11-17 – 2018-11-18 (×2): 400 mg via ORAL
  Filled 2018-11-15 (×8): qty 2

## 2018-11-15 MED ORDER — SERTRALINE HCL 50 MG PO TABS
50.0000 mg | ORAL_TABLET | Freq: Every day | ORAL | Status: DC
  Administered 2018-11-16 – 2018-11-18 (×3): 50 mg via ORAL
  Filled 2018-11-15 (×2): qty 2
  Filled 2018-11-15 (×3): qty 1
  Filled 2018-11-15: qty 2

## 2018-11-15 NOTE — BHH Group Notes (Signed)
BHH Group Notes:  (Nursing/MHT/Case Management/Adjunct)  Date:  11/15/2018  Time:  10:02 PM  Type of Therapy:  Group Therapy  Participation Level:  Did Not Attend  Ashley Griffin 11/15/2018, 10:02 PM

## 2018-11-15 NOTE — BHH Group Notes (Signed)
LCSW Group Therapy Note  11/15/2018 12:55 PM  Type of Therapy/Topic:  Group Therapy:  Emotion Regulation  Participation Level:  Did Not Attend   Description of Group:   The purpose of this group is to assist patients in learning to regulate negative emotions and experience positive emotions. Patients will be guided to discuss ways in which they have been vulnerable to their negative emotions. These vulnerabilities will be juxtaposed with experiences of positive emotions or situations, and patients will be challenged to use positive emotions to combat negative ones. Special emphasis will be placed on coping with negative emotions in conflict situations, and patients will process healthy conflict resolution skills.  Therapeutic Goals: 1. Patient will identify two positive emotions or experiences to reflect on in order to balance out negative emotions 2. Patient will label two or more emotions that they find the most difficult to experience 3. Patient will demonstrate positive conflict resolution skills through discussion and/or role plays  Summary of Patient Progress: x   Therapeutic Modalities:   Cognitive Behavioral Therapy Feelings Identification Dialectical Behavioral Therapy   Aby Gessel, MSW, LCSW Clinical Social Work 11/15/2018 12:55 PM    

## 2018-11-15 NOTE — BHH Counselor (Signed)
[facility] faxed over the requested information to [facility].  CSW received confirmation that fax was successful.  [name], MSW, LCSW 11/15/2018 11:03 AM

## 2018-11-15 NOTE — Plan of Care (Signed)
  Problem: Education: Goal: Utilization of techniques to improve thought processes will improve 11/15/2018 2257 by Nicole Kindred, RN Outcome: Not Progressing 11/15/2018 2122 by Nicole Kindred, RN Outcome: Not Progressing Goal: Knowledge of the prescribed therapeutic regimen will improve 11/15/2018 2257 by Nicole Kindred, RN Outcome: Not Progressing 11/15/2018 2122 by Nicole Kindred, RN Outcome: Not Progressing   Problem: Coping: Goal: Coping ability will improve 11/15/2018 2257 by Nicole Kindred, RN Outcome: Not Progressing 11/15/2018 2122 by Nicole Kindred, RN Outcome: Not Progressing Goal: Will verbalize feelings 11/15/2018 2257 by Nicole Kindred, RN Outcome: Not Progressing 11/15/2018 2122 by Nicole Kindred, RN Outcome: Not Progressing   Problem: Health Behavior/Discharge Planning: Goal: Identification of resources available to assist in meeting health care needs will improve 11/15/2018 2257 by Nicole Kindred, RN Outcome: Not Progressing 11/15/2018 2122 by Nicole Kindred, RN Outcome: Not Progressing   Problem: Self-Concept: Goal: Will verbalize positive feelings about self 11/15/2018 2257 by Nicole Kindred, RN Outcome: Not Progressing 11/15/2018 2122 by Nicole Kindred, RN Outcome: Not Progressing   Problem: Coping: Goal: Coping ability will improve 11/15/2018 2257 by Nicole Kindred, RN Outcome: Not Progressing 11/15/2018 2122 by Nicole Kindred, RN Outcome: Not Progressing   Problem: Education: Goal: Knowledge of [facility] Education information/materials will improve 11/15/2018 2257 by Nicole Kindred, RN Outcome: Not Progressing 11/15/2018 2122 by Nicole Kindred, RN Outcome: Not Progressing Goal: Emotional status will improve 11/15/2018 2257 by Nicole Kindred, RN Outcome: Not Progressing 11/15/2018 2122 by Nicole Kindred, RN Outcome: Not Progressing Goal: Mental status will  improve 11/15/2018 2257 by Nicole Kindred, RN Outcome: Not Progressing 11/15/2018 2122 by Nicole Kindred, RN Outcome: Not Progressing Goal: Verbalization of understanding the information provided will improve 11/15/2018 2257 by Nicole Kindred, RN Outcome: Not Progressing 11/15/2018 2122 by Nicole Kindred, RN Outcome: Not Progressing   Problem: Activity: Goal: Interest or engagement in activities will improve 11/15/2018 2257 by Nicole Kindred, RN Outcome: Not Progressing 11/15/2018 2122 by Nicole Kindred, RN Outcome: Not Progressing Goal: Sleeping patterns will improve 11/15/2018 2257 by Nicole Kindred, RN Outcome: Not Progressing 11/15/2018 2122 by Nicole Kindred, RN Outcome: Not Progressing   Problem: Coping: Goal: Ability to verbalize frustrations and anger appropriately will improve 11/15/2018 2257 by Nicole Kindred, RN Outcome: Not Progressing 11/15/2018 2122 by Nicole Kindred, RN Outcome: Not Progressing Goal: Ability to demonstrate self-control will improve 11/15/2018 2257 by Nicole Kindred, RN Outcome: Not Progressing 11/15/2018 2122 by Nicole Kindred, RN Outcome: Not Progressing   Problem: Health Behavior/Discharge Planning: Goal: Identification of resources available to assist in meeting health care needs will improve 11/15/2018 2257 by Nicole Kindred, RN Outcome: Not Progressing 11/15/2018 2122 by Nicole Kindred, RN Outcome: Not Progressing Goal: Compliance with treatment plan for underlying cause of condition will improve 11/15/2018 2257 by Nicole Kindred, RN Outcome: Not Progressing 11/15/2018 2122 by Nicole Kindred, RN Outcome: Not Progressing   Problem: Physical Regulation: Goal: Ability to maintain clinical measurements within normal limits will improve 11/15/2018 2257 by Nicole Kindred, RN Outcome: Not Progressing 11/15/2018 2122 by Nicole Kindred, RN Outcome: Not Progressing    Problem: Safety: Goal: Periods of time without injury will increase 11/15/2018 2257 by Nicole Kindred, RN Outcome: Not Progressing 11/15/2018 2122 by Nicole Kindred, RN Outcome: Not Progressing

## 2018-11-15 NOTE — Plan of Care (Signed)
Patient continues with faltering thought processes--"this building is going to fall", "they are trying to keep [place] from falling". She walks continuously accompanied by 1:1 sitter. She has been encouraged to drink more water to help prevent her dehydration--repeating the necessity to do this has been successful. She has not show progress in reaching the goal of improved coping, remaining oriented, nor verbalize positive feelings about herself.

## 2018-11-15 NOTE — Progress Notes (Signed)
Patient on 1:1 no distress noted, affect is flat, sitting in the floor in the bathroom with eyes closed, patient assisted to her bed.  continues to be on 1:1, thoughts are disorganized, responding to internal stimuli , noted hallucinating looking for her husband under the bed and she was frequently reoriented , 15 minutes safety checks maintained, will continue to monitor.

## 2018-11-15 NOTE — Progress Notes (Signed)
D- Patient alert and oriented. Affect/mood. Denies SI, HI, AVH, and pain. Quotes. Goal. How did they do with achieving previous goal. A- Scheduled medications administered to patient, per MD orders. Support and encouragement provided.  Routine safety checks conducted every 15 minutes.  Patient informed to notify staff with problems or concerns. R- No adverse drug reactions noted. Patient contracts for safety at this time. Patient compliant with medications and treatment plan. Patient receptive, calm, and cooperative. Patient interacts well with others on the unit.  Patient remains safe at this time. 

## 2018-11-15 NOTE — Progress Notes (Signed)
D- Patient alert,but disorientedx2. She continues very anxious and apprehensive. She is convinced that the building is going to fall and we must get hep. Goal: no set goal since she is not focused at this time. A- Scheduled medications administered to patient, per MD orders. Support and encouragement provided.  Routine safety checks conducted every 15 minutes.  Patient"s  sitter informed to notify staff with problems or concern R- No adverse drug reactions noted.  Patient remains safe at this time.

## 2018-11-15 NOTE — Plan of Care (Signed)
Patient is alert to self, very disorganized and confused. Patient is actively having visual hallucinations, flight of ideas, inconsistent in thoughts patient states, "I know if I sit down the ceiling is falling down from the third floor, this is really happening I don't want anyone to be confused I did not want the doctor to be confused." Patient is religiously preoccupied, impulsive and poor judgement and safety awareness. Patient is with a Recruitment consultant due to poor judgement. Problem: Education: Goal: Utilization of techniques to improve thought processes will improve Outcome: Not Progressing Goal: Knowledge of the prescribed therapeutic regimen will improve Outcome: Not Progressing   Problem: Coping: Goal: Coping ability will improve Outcome: Not Progressing Goal: Will verbalize feelings Outcome: Not Progressing   Problem: Health Behavior/Discharge Planning: Goal: Identification of resources available to assist in meeting health care needs will improve Outcome: Not Progressing   Problem: Self-Concept: Goal: Will verbalize positive feelings about self Outcome: Not Progressing

## 2018-11-15 NOTE — Progress Notes (Signed)
Patient is alert to self, continues to be confused, but also tearful stating," I feel like I am going to hell." Patient could not explain the reasoning behind the feeling. Patient continues to have a 1:1 sitter for safety. Patient did receive an Ativan tablet for anxiety which resulted in patient taking a 30 minute nap. No self harming behaviors observed.

## 2018-11-15 NOTE — Tx Team (Signed)
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 acetaminophen (TYLENOL) tablet 650 mg  650 mg Oral Q6H PRN Catalina Gravel, NP      . alum & mag hydroxide-simeth (MAALOX/MYLANTA) 200-200-20 MG/5ML suspension 30 mL  30 mL Oral Q4H PRN Thomspon, Adela Lank, NP      . feeding supplement (ENSURE ENLIVE) (ENSURE ENLIVE) liquid 237 mL  237 mL Oral BID BM Clapacs, John T, MD   237 mL at 11/15/18 0949  . hydrOXYzine (ATARAX/VISTARIL) tablet 25 mg  25 mg Oral Q6H PRN Catalina Gravel, NP   25 mg at 11/14/18 0940  . ibuprofen (ADVIL,MOTRIN) tablet 400 mg  400 mg Oral Q6H PRN Catalina Gravel, NP      . lisinopril (PRINIVIL,ZESTRIL) tablet 10 mg  10 mg Oral Daily Clapacs, Jackquline Denmark, MD   10 mg at 11/15/18 0801  . loratadine (CLARITIN) tablet 10 mg  10 mg Oral Daily Catalina Gravel, NP   10 mg at 11/15/18 0801  . LORazepam (ATIVAN) tablet 2 mg  2 mg Oral Q4H PRN Clapacs, Jackquline Denmark, MD   2 mg at 11/14/18 2153  . magnesium hydroxide (MILK OF MAGNESIA) suspension 30 mL  30 mL Oral Daily PRN Catalina Gravel, NP      . multivitamin with minerals tablet 1 tablet  1 tablet Oral Daily Clapacs, Jackquline Denmark, MD   1 tablet at 11/15/18 0801  . QUEtiapine (SEROQUEL) tablet 25 mg  25 mg Oral Q6H PRN Thalia Party, MD   25 mg at 11/14/18 0940  . QUEtiapine (SEROQUEL) tablet 300 mg  300 mg Oral QHS Clapacs, John T, MD   300 mg at 11/14/18 2151  . risperiDONE (RISPERDAL M-TABS) disintegrating tablet 1 mg  1 mg Oral Q6H PRN Clapacs, Jackquline Denmark, MD   1 mg at 11/14/18 1022  . sertraline (ZOLOFT) tablet 25 mg  25 mg Oral Daily Catalina Gravel, NP   25 mg at 11/15/18 0801  . traZODone (DESYREL) tablet 50 mg  50 mg Oral QHS Catalina Gravel, NP   50 mg at 11/14/18 2151   PTA Medications: Medications Prior to Admission  Medication Sig Dispense Refill Last Dose  . diltiazem (CARDIZEM) 60 MG tablet Take 60 mg by mouth every 12 (twelve) hours.     . fexofenadine (ALLEGRA) 180 MG tablet Take 180 mg by mouth as needed.     . hydrOXYzine (ATARAX/VISTARIL) 25 MG tablet Take 25 mg by mouth every 6 (six) hours as needed for sleep.   11/09/2018 at 0700  . ibuprofen (ADVIL,MOTRIN) 200 MG tablet Take 600 mg by mouth every 6 (six) hours as needed for pain.     Marland Kitchen sertraline (ZOLOFT) 50 MG tablet Take 25 mg by mouth daily.     . traZODone (DESYREL) 50 MG tablet Take 50-100 mg by mouth at bedtime.       Patient Stressors: Financial difficulties Marital or family conflict Medication change or noncompliance Occupational concerns  Patient Strengths: Average or above average Radio producer for treatment/growth Religious Affiliation  Treatment Modalities: Medication Management, Group therapy, Case management,  1 to 1 session with clinician, Psychoeducation, Recreational therapy.   Physician Treatment Plan  for Primary Diagnosis: Brief reactive psychosis (HCC) Long Term Goal(s): Improvement in symptoms so as ready for discharge Improvement in symptoms so as ready for discharge   Short Term Goals: Ability to verbalize feelings will improve Ability to demonstrate self-control will improve Ability to identify and develop effective coping behaviors will improve Compliance with prescribed medications will improve  Medication Management: Evaluate patient's response, side effects, and tolerance of medication regimen.  Therapeutic Interventions: 1 to 1 sessions, Unit Group sessions and Medication administration.  Evaluation of Outcomes: Not Progressing  Physician Treatment Plan for Secondary Diagnosis:  Principal Problem:   Brief reactive psychosis (HCC) Active Problems:   Depression   Anxiety state  Long Term Goal(s): Improvement in symptoms so as ready for discharge Improvement in symptoms so as ready for discharge   Short Term Goals: Ability to verbalize feelings will improve Ability to demonstrate self-control will improve Ability to identify and develop effective coping behaviors will improve Compliance with prescribed medications will improve     Medication Management: Evaluate patient's response, side effects, and tolerance of medication regimen.  Therapeutic Interventions: 1 to 1 sessions, Unit Group sessions and Medication administration.  Evaluation of Outcomes: Not Progressing   RN Treatment Plan for Primary Diagnosis: Brief reactive psychosis (HCC) Long Term Goal(s): Knowledge of disease and therapeutic regimen to maintain health will improve  Short Term Goals: Ability to demonstrate self-control, Ability to participate in decision making will improve, Ability to verbalize feelings will improve, Ability to disclose and discuss suicidal ideas and Ability to identify and develop effective coping behaviors will improve  Medication Management: RN will administer medications as ordered by provider, will assess and evaluate patient's response and provide education to patient for prescribed medication. RN will report any adverse and/or side effects to prescribing provider.  Therapeutic Interventions: 1 on 1 counseling sessions, Psychoeducation, Medication administration, Evaluate responses to treatment, Monitor vital signs and CBGs as ordered, Perform/monitor CIWA, COWS, AIMS and Fall Risk screenings as ordered, Perform wound care treatments as ordered.  Evaluation of Outcomes: Not Progressing   LCSW Treatment Plan for Primary Diagnosis: Brief reactive psychosis (HCC) Long Term Goal(s): Safe transition to appropriate next level of care at discharge, Engage patient in  therapeutic group addressing interpersonal concerns.  Short Term Goals: Engage patient in aftercare planning with referrals and resources, Increase social support, Increase ability to appropriately verbalize feelings, Increase emotional regulation and Facilitate acceptance of mental health diagnosis and concerns  Therapeutic Interventions: Assess for all discharge needs, 1 to 1 time with Social worker, Explore available resources and support systems, Assess for adequacy in community support network, Educate family and significant other(s) on suicide prevention, Complete Psychosocial Assessment, Interpersonal group therapy.  Evaluation of Outcomes: Not Progressing   Progress in Treatment: Attending groups: No. Participating in groups: No. Taking medication as prescribed: Yes. Toleration medication: Yes. Family/Significant other contact made: Yes, individual(s) contacted:  SPE completed with the patient's husband.   Patient understands diagnosis: No. Discussing patient identified problems/goals with staff: Yes. Medical problems stabilized or resolved: Yes. Denies suicidal/homicidal ideation: Yes. Issues/concerns per patient self-inventory: No. Other: none  New problem(s) identified: No, Describe:  none  New Short Term/Long Term Goal(s): elimination of symptoms of psychosis, medication management for mood stabilization; elimination of SI thoughts; development of comprehensive mental wellness plan.  Patient Goals:  "get better and go home.  Rest. I wasn't sleeping."  Discharge Plan or Barriers: Patient at this time is not appropriate for discharge. Patient remains diorganized, hyper-religious and responding to internal  stimuli.     Reason for Continuation of Hospitalization: Anxiety Depression Mania Medication stabilization  Estimated Length of Stay: 1-5 days  Recreational Therapy: Patient Stressors: N/A Patient Goal: Patient will engage in groups without prompting or  encouragement from LRT x3 group sessions within 5 recreation therapy group sessions  Attendees: Patient: Ashley Griffin 11/15/2018 11:18 AM  Physician: Dr. Toni Amend, MD 11/15/2018 11:18 AM  Nursing: 11/15/2018 11:18 AM  RN Care Manager: 11/15/2018 11:18 AM  Social Worker: [name], LCSW 11/15/2018 11:18 AM  Recreational Therapist:  11/15/2018 11:18 AM  Other:  11/15/2018 11:18 AM  Other:  11/15/2018 11:18 AM  Other: 11/15/2018 11:18 AM    Scribe for Treatment Team: Harden Mo, LCSW 11/15/2018 11:18 AM

## 2018-11-15 NOTE — Progress Notes (Signed)
   11/15/18 1700  Clinical Encounter Type  Visited With Patient  Visit Type Follow-up;Psychological support;Spiritual support  Spiritual Encounters  Spiritual Needs Sacred text;Emotional  Pt was in a lot of distress due to missing her family and dealing with psychotic symptoms. She said she couldn't get much sleep because of noises in her head. She lacks trust with the care team but still recognizes her need to receive help. Pt suffers from confusion and feeling disoriented. Ch helped her remember her goal which is to get better and return to family and use that goal as a source of staying grounded. Ch also brought the bible to the pt as requested and read a scripture to her.

## 2018-11-15 NOTE — Progress Notes (Signed)
[facility]  11/15/2018 5:50 PM Ashley Griffin  MRN:  [id number] Subjective: Follow-up for this woman with psychotic symptoms.  Patient seen chart reviewed.  As recently as last night the patient was super agitated at night requiring as needed injections just to get her to calm down and go to bed.  Today she seems to be settling down a little bit although she is still psychotic.  In conversation at least her sentences hold together but she is still hyper religious and believes that she is in some kind of "game" and that the voice of Prudy FeelerSatan is in her throat.  She has not acted out aggressively today.  Not threatening self-harm.  Poor memory.  Confusion.  No sign however of any side effects from medicine.  I did get an EKG yesterday and looked at it and it was reassuringly normal. Principal Problem: Brief reactive psychosis (HCC) Diagnosis: Principal Problem:   Brief reactive psychosis (HCC) Active Problems:   Depression   Anxiety state  Total Time spent with patient: 30 minutes  Past Psychiatric History: Patient has no prior significant psychiatric history  Past Medical History: History reviewed. No pertinent past medical history. History reviewed. No pertinent surgical history. Family History: History reviewed. No pertinent family history. Family Psychiatric  History: None known Social History:  Social History   Substance and Sexual Activity  Alcohol Use Not Currently  . Frequency: Never     Social History   Substance and Sexual Activity  Drug Use Not on file    Social History   Socioeconomic History  . Marital status: Married    Spouse name: Not on file  . Number of children: Not on file  . Years of education: Not on file  . Highest education level: Not on file  Occupational History  . Not on file  Social Needs  . Financial resource strain: Not on file  . Food insecurity:    Worry: Not on file    Inability: Not on file  . Transportation needs:    Medical: Not on  file    Non-medical: Not on file  Tobacco Use  . Smoking status: Never Smoker  . Smokeless tobacco: Never Used  Substance and Sexual Activity  . Alcohol use: Not Currently    Frequency: Never  . Drug use: Not on file  . Sexual activity: Not on file  Lifestyle  . Physical activity:    Days per week: Not on file    Minutes per session: Not on file  . Stress: Not on file  Relationships  . Social connections:    Talks on phone: Not on file    Gets together: Not on file    Attends religious service: Not on file    Active member of club or organization: Not on file    Attends meetings of clubs or organizations: Not on file    Relationship status: Not on file  Other Topics Concern  . Not on file  Social History Narrative  . Not on file   Additional Social History:                         Sleep: Fair  Appetite:  Fair  Current Medications: Current Facility-Administered Medications  Medication Dose Route Frequency Provider Last Rate Last Dose  . acetaminophen (TYLENOL) tablet 650 mg  650 mg Oral Q6H PRN Catalina Gravelhomspon, Jacqueline, NP      . alum & mag hydroxide-simeth (MAALOX/MYLANTA) 200-200-20  MG/5ML suspension 30 mL  30 mL Oral Q4H PRN Thomspon, Adela Lank, NP      . feeding supplement (ENSURE ENLIVE) (ENSURE ENLIVE) liquid 237 mL  237 mL Oral BID BM Johara Lodwick T, MD   237 mL at 11/15/18 1413  . hydrOXYzine (ATARAX/VISTARIL) tablet 25 mg  25 mg Oral Q6H PRN Catalina Gravel, NP   25 mg at 11/14/18 0940  . ibuprofen (ADVIL,MOTRIN) tablet 400 mg  400 mg Oral Q6H PRN Catalina Gravel, NP      . lisinopril (PRINIVIL,ZESTRIL) tablet 10 mg  10 mg Oral Daily Tashawn Laswell, Jackquline Denmark, MD   10 mg at 11/15/18 0801  . loratadine (CLARITIN) tablet 10 mg  10 mg Oral Daily Catalina Gravel, NP   10 mg at 11/15/18 0801  . LORazepam (ATIVAN) tablet 2 mg  2 mg Oral Q4H PRN Synthia Fairbank, Jackquline Denmark, MD   2 mg at 11/15/18 1421  . magnesium hydroxide (MILK OF MAGNESIA) suspension 30 mL  30 mL  Oral Daily PRN Catalina Gravel, NP      . multivitamin with minerals tablet 1 tablet  1 tablet Oral Daily Anwita Mencer, Jackquline Denmark, MD   1 tablet at 11/15/18 0801  . QUEtiapine (SEROQUEL) tablet 25 mg  25 mg Oral Q6H PRN Thalia Party, MD   25 mg at 11/14/18 0940  . QUEtiapine (SEROQUEL) tablet 400 mg  400 mg Oral QHS Lidiya Reise T, MD      . risperiDONE (RISPERDAL M-TABS) disintegrating tablet 1 mg  1 mg Oral Q6H PRN Bretta Fees, Jackquline Denmark, MD   1 mg at 11/14/18 1022  . [START ON 11/16/2018] sertraline (ZOLOFT) tablet 50 mg  50 mg Oral Daily Roiza Wiedel T, MD      . traZODone (DESYREL) tablet 50 mg  50 mg Oral QHS Catalina Gravel, NP   50 mg at 11/14/18 2151    Lab Results:  Results for orders placed or performed during the hospital encounter of 11/09/18 (from the past 48 hour(s))  Urinalysis, Complete w Microscopic     Status: Abnormal   Collection Time: 11/14/18  4:49 PM  Result Value Ref Range   Color, Urine YELLOW (A) YELLOW   APPearance CLEAR (A) CLEAR   Specific Gravity, Urine 1.014 1.005 - 1.030   pH 7.0 5.0 - 8.0   Glucose, UA NEGATIVE NEGATIVE mg/dL   Hgb urine dipstick NEGATIVE NEGATIVE   Bilirubin Urine NEGATIVE NEGATIVE   Ketones, ur NEGATIVE NEGATIVE mg/dL   Protein, ur NEGATIVE NEGATIVE mg/dL   Nitrite NEGATIVE NEGATIVE   Leukocytes,Ua NEGATIVE NEGATIVE   WBC, UA 0-5 0 - 5 WBC/hpf   Bacteria, UA NONE SEEN NONE SEEN   Squamous Epithelial / LPF 0-5 0 - 5   Amorphous Crystal PRESENT     Comment: Performed at [facility], [address] [place], [place] [address]  TSH     Status: Abnormal   Collection Time: 11/14/18  5:51 PM  Result Value Ref Range   TSH 4.625 (H) 0.350 - 4.500 uIU/mL    Comment: Performed by a 3rd Generation assay with a functional sensitivity of <=0.01 uIU/mL. Performed at [facility], [address] [place], [place] [address]     Blood Alcohol level:  Lab Results  Component Value Date   [facility] <10 11/09/2018    Metabolic  Disorder Labs: No results found for: HGBA1C, MPG No results found for: PROLACTIN No results found for: CHOL, TRIG, HDL, CHOLHDL, VLDL, LDLCALC  Physical Findings: AIMS: Facial and  Oral Movements Muscles of Facial Expression: None, normal Lips and Perioral Area: None, normal Jaw: None, normal Tongue: None, normal,Extremity Movements Upper (arms, wrists, hands, fingers): None, normal Lower (legs, knees, ankles, toes): None, normal, Trunk Movements Neck, shoulders, hips: None, normal, Overall Severity Severity of abnormal movements (highest score from questions above): None, normal Incapacitation due to abnormal movements: None, normal Patient's awareness of abnormal movements (rate only patient's report): No Awareness, Dental Status Current problems with teeth and/or dentures?: No Does patient usually wear dentures?: No  CIWA:    COWS:  COWS Total Score: 6  Musculoskeletal: Strength & Muscle Tone: within normal limits Gait & Station: normal Patient leans: N/A  Psychiatric Specialty Exam: Physical Exam  Nursing note and vitals reviewed. Constitutional: She appears well-developed and well-nourished.  HENT:  Head: Normocephalic and atraumatic.  Eyes: Pupils are equal, round, and reactive to light. Conjunctivae are normal.  Neck: Normal range of motion.  Cardiovascular: Regular rhythm and normal heart sounds.  Respiratory: Effort normal.  GI: Soft.  Musculoskeletal: Normal range of motion.  Neurological: She is alert.  Skin: Skin is warm and dry.  Psychiatric: Her affect is blunt. Her speech is tangential. She is slowed and withdrawn. Thought content is paranoid and delusional. Cognition and memory are impaired. She expresses inappropriate judgment.    Review of Systems  Constitutional: Negative.   HENT: Negative.   Eyes: Negative.   Respiratory: Negative.   Cardiovascular: Negative.   Gastrointestinal: Negative.   Musculoskeletal: Negative.   Skin: Negative.    Neurological: Negative.   Psychiatric/Behavioral: Positive for depression and hallucinations. Negative for memory loss, substance abuse and suicidal ideas. The patient is nervous/anxious and has insomnia.     Blood pressure 124/84, pulse (!) 107, temperature 98 F (36.7 C), temperature source Oral, resp. rate 16, height 5\' 1"  (1.549 m), weight 68.9 kg, last menstrual period 11/09/2018, SpO2 100 %.Body mass index is 28.72 kg/m.  General Appearance: Casual  Eye Contact:  Fair  Speech:  Slow  Volume:  Decreased  Mood:  Anxious and Dysphoric  Affect:  Constricted and Inappropriate  Thought Process:  Disorganized  Orientation:  Negative  Thought Content:  Illogical, Delusions, Paranoid Ideation and Tangential  Suicidal Thoughts:  No  Homicidal Thoughts:  No  Memory:  Immediate;   Fair Recent;   Fair Remote;   Fair  Judgement:  Impaired  Insight:  Shallow  Psychomotor Activity:  Decreased  Concentration:  Concentration: Poor  Recall:  Poor  Fund of Knowledge:  Fair  Language:  Good  Akathisia:  No  Handed:  Right  AIMS (if indicated):     Assets:  Desire for Improvement Housing Physical Health Resilience Social Support  ADL's:  Impaired  Cognition:  Impaired,  Mild  Sleep:  Number of Hours: 4.5     Treatment Plan Summary: Daily contact with patient to assess and evaluate symptoms and progress in treatment, Medication management and Plan Patient still remains psychotic.  I am increasing her Seroquel dose to 400 mg tonight.  Tried to gently do some psychoeducation without upsetting her or making her more paranoid today.  I did speak to her husband yesterday and made it clear to him that it was going to probably at this point take some time for her to fully recover.  Both the patient and her husband had requested that we look into possible transfer to [facility].  Social work did do diligence to make the appropriate referral but they do not have  any bed available  and are not willing to accept the patient at this time.  Reassured the patient and the husband that we feel we are trying to do our best to get her through this.  Continue the one-to-one for now given the recent unpredictable behavior.  Mordecai Rasmussen, MD 11/15/2018, 5:50 PM

## 2018-11-15 NOTE — Progress Notes (Signed)
Patient on 1:1 no distress noted, affect is flat, she appears restless running around the unit, yelling loudly saying " l want my husband" she was frequently redirected but she wont follow simple commands, patient was eventually escorted to her and was given an injection shot of ( benadryl, lorazepam and Haldol). Patient is still restless, psychotic; responding to internal stimuli, and she was always redirected for safety. 15 minutes safety checks maintained will continue to monitor.

## 2018-11-15 NOTE — [facility] Counselor (Signed)
CSW called [facility] at pt's husbands request to identify if there is a bed available and pt can be transferred.  CSW was informed that CSW needs to send "last 6 of her chart and her demographics".   [name], MSW, LCSW 11/15/2018 10:08 AM

## 2018-11-15 NOTE — Progress Notes (Signed)
Patient is on a 1:1 sitter case due to confusion and impulsiveness on the unit. Patient remains disorganized. Patient tried to attend group but could not sit still enough to participate. Patient paces the unit with 1:1 sitter within arms reach. No self harming behaviors observed.

## 2018-11-15 NOTE — Progress Notes (Signed)
Recreation Therapy Notes   Date: 11/15/2018  Time: 9:30 am  Location: Craft Room  Behavioral response: Appropriate  Intervention Topic: Emotions  Discussion/Intervention:  Group content on today was focused on emotions. The group identified what emotions are and why it is important to have emotions. Patients expressed some positive and negative emotions. Individuals gave some past experiences on how they normally dealt with emotions in the past. The group described some positive ways to deal with emotions in the future. Patients participated in the intervention "Name the Megan Salon" where individuals were given a chance to experience different emotions.  Clinical Observations/Feedback:  Patient came to group stared into space and eventually went back to her room.  Phung Kotas LRT/CTRS          Madigan Rosensteel 11/15/2018 11:11 AM

## 2018-11-15 NOTE — BHH Counselor (Signed)
CSW spoke with Rosanne Ashing at [facility]. He confirms that he has received the paperwork.  He reports psych charge nurse will reivew the paperwork, then inform the Transfer [facility] if patient is accepted or denied.  Transfer Center will call this CSW to inform of decision.   CSW verified that Transfer Center had correct phone number.    [name], MSW, LCSW 11/15/2018 11:58 AM

## 2018-11-16 MED ORDER — PROPRANOLOL HCL 20 MG PO TABS
10.0000 mg | ORAL_TABLET | Freq: Once | ORAL | Status: AC
  Administered 2018-11-16: 10 mg via ORAL
  Filled 2018-11-16: qty 1

## 2018-11-16 NOTE — Progress Notes (Signed)
[facility]  11/16/2018 5:51 PM Shellia Carwinmanda M Eskin  MRN:  [id number] Subjective: Follow-up for this patient having a psychotic episode.  Patient seen chart reviewed.  Case reviewed with treatment team.  First thing this morning I was notified that she was tachycardic.  Blood pressure normal.  This could be a side effect of the Seroquel.  We tried a one-time dose of propranolol.  Patient not symptomatic from it.  Psychiatrically this afternoon she seems improved.  She took a long nap today and got some rest last night which seems to have helped.  Patient did not start in with the paranoid psychotic talk today.  Seem more lucid and reality based.  Still a little bit hyper religious but not excessively so.  No new physical complaints herself.  No sign of acute suicidality.  I spoke with her husband this evening and he is understanding and asking appropriate questions. Principal Problem: Brief reactive psychosis (HCC) Diagnosis: Principal Problem:   Brief reactive psychosis (HCC) Active Problems:   Depression   Anxiety state  Total Time spent with patient: 30 minutes  Past Psychiatric History: Really no significant past history no history of prior treatment  Past Medical History: History reviewed. No pertinent past medical history. History reviewed. No pertinent surgical history. Family History: History reviewed. No pertinent family history. Family Psychiatric  History: None noted Social History:  Social History   Substance and Sexual Activity  Alcohol Use Not Currently  . Frequency: Never     Social History   Substance and Sexual Activity  Drug Use Not on file    Social History   Socioeconomic History  . Marital status: Married    Spouse name: Not on file  . Number of children: Not on file  . Years of education: Not on file  . Highest education level: Not on file  Occupational History  . Not on file  Social Needs  . Financial resource strain: Not on file  . Food insecurity:     Worry: Not on file    Inability: Not on file  . Transportation needs:    Medical: Not on file    Non-medical: Not on file  Tobacco Use  . Smoking status: Never Smoker  . Smokeless tobacco: Never Used  Substance and Sexual Activity  . Alcohol use: Not Currently    Frequency: Never  . Drug use: Not on file  . Sexual activity: Not on file  Lifestyle  . Physical activity:    Days per week: Not on file    Minutes per session: Not on file  . Stress: Not on file  Relationships  . Social connections:    Talks on phone: Not on file    Gets together: Not on file    Attends religious service: Not on file    Active member of club or organization: Not on file    Attends meetings of clubs or organizations: Not on file    Relationship status: Not on file  Other Topics Concern  . Not on file  Social History Narrative  . Not on file   Additional Social History:                         Sleep: Fair  Appetite:  Fair  Current Medications: Current Facility-Administered Medications  Medication Dose Route Frequency Provider Last Rate Last Dose  . acetaminophen (TYLENOL) tablet 650 mg  650 mg Oral Q6H PRN Catalina Gravelhomspon, Jacqueline, NP      .  alum & mag hydroxide-simeth (MAALOX/MYLANTA) 200-200-20 MG/5ML suspension 30 mL  30 mL Oral Q4H PRN Thomspon, Adela Lank, NP      . feeding supplement (ENSURE ENLIVE) (ENSURE ENLIVE) liquid 237 mL  237 mL Oral BID BM Coriana Angello T, MD   237 mL at 11/16/18 1416  . hydrOXYzine (ATARAX/VISTARIL) tablet 25 mg  25 mg Oral Q6H PRN Catalina Gravel, NP   25 mg at 11/14/18 0940  . ibuprofen (ADVIL,MOTRIN) tablet 400 mg  400 mg Oral Q6H PRN Catalina Gravel, NP      . lisinopril (PRINIVIL,ZESTRIL) tablet 10 mg  10 mg Oral Daily Geneve Kimpel, Jackquline Denmark, MD   10 mg at 11/16/18 0816  . loratadine (CLARITIN) tablet 10 mg  10 mg Oral Daily Catalina Gravel, NP   10 mg at 11/16/18 0815  . LORazepam (ATIVAN) tablet 2 mg  2 mg Oral Q4H PRN Makaylee Spielberg, Jackquline Denmark,  MD   2 mg at 11/16/18 0815  . magnesium hydroxide (MILK OF MAGNESIA) suspension 30 mL  30 mL Oral Daily PRN Catalina Gravel, NP      . multivitamin with minerals tablet 1 tablet  1 tablet Oral Daily Ashima Shrake, Jackquline Denmark, MD   1 tablet at 11/16/18 [phone]  . QUEtiapine (SEROQUEL) tablet 25 mg  25 mg Oral Q6H PRN Thalia Party, MD   25 mg at 11/14/18 0940  . QUEtiapine (SEROQUEL) tablet 400 mg  400 mg Oral QHS Samadhi Mahurin T, MD   400 mg at 11/15/18 2234  . risperiDONE (RISPERDAL M-TABS) disintegrating tablet 1 mg  1 mg Oral Q6H PRN Korissa Horsford, Jackquline Denmark, MD   1 mg at 11/16/18 0817  . sertraline (ZOLOFT) tablet 50 mg  50 mg Oral Daily Estha Few, Jackquline Denmark, MD   50 mg at 11/16/18 0817  . traZODone (DESYREL) tablet 50 mg  50 mg Oral QHS Catalina Gravel, NP   50 mg at 11/15/18 2207    Lab Results: No results found for this or any previous visit (from the past 48 hour(s)).  Blood Alcohol level:  Lab Results  Component Value Date   ETH <10 11/09/2018    Metabolic Disorder Labs: No results found for: HGBA1C, MPG No results found for: PROLACTIN No results found for: CHOL, TRIG, HDL, CHOLHDL, VLDL, LDLCALC  Physical Findings: AIMS: Facial and Oral Movements Muscles of Facial Expression: None, normal Lips and Perioral Area: None, normal Jaw: None, normal Tongue: None, normal,Extremity Movements Upper (arms, wrists, hands, fingers): None, normal Lower (legs, knees, ankles, toes): None, normal, Trunk Movements Neck, shoulders, hips: None, normal, Overall Severity Severity of abnormal movements (highest score from questions above): None, normal Incapacitation due to abnormal movements: None, normal Patient's awareness of abnormal movements (rate only patient's report): No Awareness, Dental Status Current problems with teeth and/or dentures?: No Does patient usually wear dentures?: No  CIWA:    COWS:  COWS Total Score: 6  Musculoskeletal: Strength & Muscle Tone: within normal limits Gait & Station:  normal Patient leans: N/A  Psychiatric Specialty Exam: Physical Exam  Nursing note and vitals reviewed. Constitutional: She appears well-developed and well-nourished.  HENT:  Head: Normocephalic and atraumatic.  Eyes: Pupils are equal, round, and reactive to light. Conjunctivae are normal.  Neck: Normal range of motion.  Cardiovascular: Regular rhythm and normal heart sounds.  Respiratory: Effort normal. No respiratory distress.  GI: Soft.  Musculoskeletal: Normal range of motion.  Neurological: She is alert.  Skin: Skin is warm and dry.  Psychiatric: Her mood appears anxious. Her speech  is delayed. She is slowed. She is not agitated. Cognition and memory are impaired. She expresses impulsivity. She expresses no homicidal and no suicidal ideation.    Review of Systems  Constitutional: Negative.   HENT: Negative.   Eyes: Negative.   Respiratory: Negative.   Cardiovascular: Negative.   Gastrointestinal: Negative.   Musculoskeletal: Negative.   Skin: Negative.   Neurological: Negative.   Psychiatric/Behavioral: Positive for depression and memory loss. Negative for hallucinations, substance abuse and suicidal ideas. The patient is nervous/anxious. The patient does not have insomnia.     Blood pressure (!) 139/93, pulse (!) 135, temperature 98 F (36.7 C), temperature source Oral, resp. rate 18, height 5\' 1"  (1.549 m), weight 68.9 kg, last menstrual period 11/09/2018, SpO2 100 %.Body mass index is 28.72 kg/m.  General Appearance: Disheveled  Eye Contact:  Good  Speech:  Slow  Volume:  Decreased  Mood:  Euthymic  Affect:  Constricted  Thought Process:  Coherent  Orientation:  Full (Time, Place, and Person)  Thought Content:  Tangential  Suicidal Thoughts:  No  Homicidal Thoughts:  No  Memory:  Immediate;   Fair Recent;   Fair Remote;   Fair  Judgement:  Fair  Insight:  Fair  Psychomotor Activity:  Decreased  Concentration:  Concentration: Poor  Recall:  [employer] of  Knowledge:  Fair  Language:  Fair  Akathisia:  No  Handed:  Right  AIMS (if indicated):     Assets:  Desire for Improvement Housing Physical Health Resilience Social Support  ADL's:  Impaired  Cognition:  Impaired,  Mild  Sleep:  Number of Hours: 5.3     Treatment Plan Summary: Daily contact with patient to assess and evaluate symptoms and progress in treatment, Medication management and Plan Patient is showing some improvement.  I hope that the tachycardia this morning will not be a limitation on the Seroquel because she otherwise seems to be tolerating it well and showing good improvement.  Discussed treatment plan with patient and husband.  Still showing some confusion and disordered mood and thinking.  Anticipate likely stay at least through the weekend.  Mordecai Rasmussen, MD 11/16/2018, 5:51 PM

## 2018-11-16 NOTE — BHH Group Notes (Signed)
LCSW Group Therapy Note  11/16/2018 1:00 PM  Type of Therapy/Topic:  Group Therapy:  Balance in Life  Participation Level:  Minimal  Description of Group:    This group will address the concept of balance and how it feels and looks when one is unbalanced. Patients will be encouraged to process areas in their lives that are out of balance and identify reasons for remaining unbalanced. Facilitators will guide patients in utilizing problem-solving interventions to address and correct the stressor making their life unbalanced. Understanding and applying boundaries will be explored and addressed for obtaining and maintaining a balanced life. Patients will be encouraged to explore ways to assertively make their unbalanced needs known to significant others in their lives, using other group members and facilitator for support and feedback.  Therapeutic Goals: 1. Patient will identify two or more emotions or situations they have that consume much of in their lives. 2. Patient will identify signs/triggers that life has become out of balance:  3. Patient will identify two ways to set boundaries in order to achieve balance in their lives:  4. Patient will demonstrate ability to communicate their needs through discussion and/or role plays  Summary of Patient Progress: Pt attended group, however did not engage in discussion.   Therapeutic Modalities:   Cognitive Behavioral Therapy Solution-Focused Therapy Assertiveness Training  [name] MSW, LCSW 11/16/2018 2:21 PM

## 2018-11-16 NOTE — BHH Counselor (Signed)
CSW returned call to the patient's husband following a voicemail.  Husband reported that he would like daily updates regarding the patient's care.  Husband requested email updates.  CSW informed that she does not provide her email.  Husband requested to have Dr. Toni Amend email and CSW informed that she would staff with Dr. Toni Amend and follow up.    CSW updated husband that patient was somewhat calmer yesterday, however, she remained disorganized in thinking.    [name], MSW, LCSW 11/16/2018 8:22 AM

## 2018-11-16 NOTE — Progress Notes (Signed)
Recreation Therapy Notes    Date: 11/16/2018  Time: 9:30 am  Location: Craft Room  Behavioral response: Appropriate  Intervention Topic: Relaxation  Discussion/Intervention:  Group content today was focused on relaxation. The group defined relaxation and identified healthy ways to relax. Individuals expressed how much time they spend relaxing. Patients expressed how much their life would be if they did not make time for themselves to relax. The group stated ways they could improve their relaxation techniques in the future.  Individuals participated in the intervention "Time to Relax" where they had a chance to experience different relaxation techniques Clinical Observations/Feedback:  Patient came to group and explained that she normally relaxes by reading and going outside. She expressed that she likes to relax with her family. Individual was tearful during group.  Michaelene Dutan LRT/CTRS           Shanese Riemenschneider 11/16/2018 10:53 AM

## 2018-11-16 NOTE — Plan of Care (Addendum)
Patient  folded the towels and holding like a baby.Patient states "this is something precious for me."Patient had a shower.Walking in the hallway carrying the towel.Sitter with the patient.

## 2018-11-16 NOTE — BHH Counselor (Signed)
CSW spoke with the patient's husband regarding the [facility] denial.   Patient's husband asked about sedating the patient to give her mind time to rest and heal and if this was an actual medical practice.   CSW informed that she would ask Dr. Toni Amend and have Dr. Toni Amend respond as this was out of CSW scope of practice.  [name], MSW, LCSW 11/16/2018 3:23 PM

## 2018-11-16 NOTE — Progress Notes (Signed)
Patient is sad and tearful states "I miss my family."Patient is calm and slow to respond with a blunt affect.Encouragement and reassurances provided.Patient back to bed.Sitter at bedside.

## 2018-11-16 NOTE — BHH Counselor (Signed)
CSW followed up with the patient's husband from previous call.  CSW informed that doctor was considering e-mail updates, however, preferred phone.  CSW took the patient's e-mail address to provide to the doctor.  Husband expressed frustration that hospital has restricted visitors for the behavioral health unit stating that it "is not very therapeutic for them to be cut off from their family".  CSW reminded that this is hospital policy and husband has the option to call patient during phone times.    CSW updated that information has been faxed to [facility] and at this time awaiting approval or denial.  [name], MSW, LCSW 11/16/2018 9:21 AM

## 2018-11-16 NOTE — BHH Counselor (Signed)
CSW called [facility] to assess for status of transfer request.  CSW was informed that patient was denied due to "not medically necessary".  CSW reports that she will inform Dr. Toni Amend and nursing staff.      [name], MSW, LCSW 11/16/2018 2:41 PM

## 2018-11-16 NOTE — Progress Notes (Signed)
Care Hand-Off  Report given to Gigi, RN. Will endorse care to Gigi, RN at this time.  

## 2018-11-16 NOTE — Progress Notes (Signed)
1:1 Observation   0700 Pt. Walking around the unit going down the other patient hallways confused and visibly disorganized. 1:1 present for safety. Pt. Is redirectable with multiple attempts.  0800 Pt with 1:1 for safety still. Pt. Is visibly disorganized and confused, walking in the hallways. Pt. Thought process with this observor is disorganized, hyper-religous, and repetitive. Will continue to monitor.  0900 Pt. Observed in the hallway with 1:1 for safety. Pt. Confused and disorganized still, but reduction in anxiety and agitation observed.

## 2018-11-17 NOTE — Plan of Care (Signed)
Patient continues to be less anxious and sad today with the current sitter. Able to tolerate medications to help her reduce anxiety and distress. Observed to ambulate in hallway and much more subdued today. Makes more direct eye contact today also. Problem: Coping: Goal: Coping ability will improve Outcome: Progressing   Problem: Education: Goal: Knowledge of [place] [facility] Education information/materials will improve Outcome: Progressing Goal: Emotional status will improve Outcome: Progressing Goal: Mental status will improve Outcome: Progressing   Problem: Activity: Goal: Interest or engagement in activities will improve Outcome: Progressing Goal: Sleeping patterns will improve Outcome: Progressing   Problem: Coping: Goal: Ability to verbalize frustrations and anger appropriately will improve Outcome: Progressing

## 2018-11-17 NOTE — Plan of Care (Signed)
Patient slightly less anxious than previous days, cooperating with sitter and ambulating without difficulty, ate evening meal and was able to tolerate prn medicines to help reduce her anxiety with satisfactory outcome. Observed to have improved rapport with sitter today.  Problem: Coping: Goal: Coping ability will improve Outcome: Progressing   Problem: Education: Goal: Knowledge of [place] [facility] Education information/materials will improve Outcome: Progressing Goal: Emotional status will improve Outcome: Progressing Goal: Mental status will improve Outcome: Progressing   Problem: Activity: Goal: Interest or engagement in activities will improve Outcome: Progressing Goal: Sleeping patterns will improve Outcome: Progressing   Problem: Coping: Goal: Ability to verbalize frustrations and anger appropriately will improve Outcome: Progressing

## 2018-11-17 NOTE — Progress Notes (Signed)
D- Patient continues subdued with depressed facies. . Affect and mood slightly improved over previous day.. Quotes "I want to go home."  No current goals set but observed to cooperate with the sitter today.l. A- Scheduled medications administered to patient, per MD orders. Support and encouragement provided.  Routine safety checks conducted every 15 minutes.  Patient informed to notify staff with problems or concerns. R- No adverse drug reactions noted. Patient contracts for safety at this time. Patient receptive, calm, and cooperative  Today.  Patient remains safe at this time. Observed to spend some time in break room today unlike previous days.

## 2018-11-17 NOTE — Plan of Care (Signed)
Patient could make logical conversation with staff today.Patient stated that she was able to sleep last night.Patient states "I have a lot in my mind".Patient did not elaborate on that.Patient states "I miss my family but I have to be here for few more days."Patient stated that she is having some memory problem.Patient could not able to tell her baby's name.Denies SI,HI and AVH.Appetite and energy level good.Attended groups.Sitter with patient.Support and encouragement given.

## 2018-11-17 NOTE — BHH Counselor (Signed)
CSW, Dr. Weber Cooks, 1 to 1 sitter and patient met for treatment team.  Dr. Weber Cooks reviewd with the patient that goal is to suppor tthe patient in feeling more stable and less anxiety.  Dr. Weber Cooks also reviewd medication with the patient.  Patient appeared to take notes on the medications.  Dr. Weber Cooks also reviewed that the earliest the patient could possibly be discharged was on Monday 11/20/2018.  Patient became emotional and upset and was adamant that she be able to go home today.  CSW and doctor explained that at this time the plan is for patient to remain for further stability and it will be reviewed next week.  Patient began to make statements of not knowing who to trust and CSW and doctor pointed out that staff are here to support the patient.    CSW attempted to identify which aftercare providers the patient would like to be set up with.  Pt reports that she does not remember being provided previous paperwork.  CSW informed her that she will provide the patient with a third copy.  CSW did provide the 3rd copy.     Assunta Curtis, MSW, LCSW 11/17/2018 11:04 AM

## 2018-11-17 NOTE — BHH Counselor (Signed)
CSW spoke with the patient to check in.  Patient requested that she have another treatment team meeting and was adamant that she has not had one.  CSW reminded patient that she has had a treatment team meeting previously, however, would follow up with Dr. Toni Amend.    [name], MSW, LCSW 11/17/2018 11:00 AM

## 2018-11-17 NOTE — BHH Counselor (Signed)
CSW spoke with the patient's husband. CSW updated husband on the patient's progress.    Husband expressed concern for someone to follow up with pt on her feelings regarding Mardella Layman, the [place] and Britta Mccreedy, pt's best friend.  Husband expressed concern as "both will be integral to her wellbeing once she gets out".  CSW informed that this CSW or weekend CSW will follow up with this.   [name], MSW, LCSW 11/17/2018 3:09 PM

## 2018-11-17 NOTE — Progress Notes (Signed)
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  Patient seen chart reviewed.  Patient was able to hold a lucid conversation today.  She reports that she feels like she is thinking more clearly.  Reports that her mood is better.  She was able to sustain a conversation without religious reference or showing signs of disorganized hyper religiosity.  Affect remains blunted.  Slightly irritable today.  She was requesting discharge and it was explained to her that I did not feel that that was yet clinically appropriate although she is getting better.  Patient is tolerating current medicines.  I note that her pulse is down into the normal range which is good given that we were worried about tachycardia yesterday. Principal Problem: Brief reactive psychosis (HCC) Diagnosis: Principal Problem:   Brief reactive psychosis (HCC) Active Problems:   Depression   Anxiety state  Total Time spent with patient: 30 minutes  Past Psychiatric History: Patient has no significant past psychiatric history although the husband told me yesterday that he thinks that in retrospect she may have had some symptoms of depression ever since the birth of their most recent childhood  Past Medical History: History reviewed. No pertinent past medical history. History reviewed. No pertinent surgical history. Family History: History reviewed. No pertinent family history. Family Psychiatric  History: See previous Social History:  Social History   Substance and Sexual Activity  Alcohol Use Not Currently  . Frequency: Never     Social History   Substance and Sexual Activity  Drug Use Not on file    Social History   Socioeconomic History  . Marital status: Married    Spouse name: Not on file  . Number of children: Not on file  . Years of education:  Not on file  . Highest education level: Not on file  Occupational History  . Not on file  Social Needs  . Financial resource strain: Not on file  . Food insecurity:    Worry: Not on file    Inability: Not on file  . Transportation needs:    Medical: Not on file    Non-medical: Not on file  Tobacco Use  . Smoking status: Never Smoker  . Smokeless tobacco: Never Used  Substance and Sexual Activity  . Alcohol use: Not Currently    Frequency: Never  . Drug use: Not on file  . Sexual activity: Not on file  Lifestyle  . Physical activity:    Days per week: Not on file    Minutes per session: Not on file  . Stress: Not on file  Relationships  . Social connections:    Talks on phone: Not on file    Gets together: Not on file    Attends religious service: Not on file    Active member of club or organization: Not on file    Attends meetings of clubs or organizations: Not on file    Relationship status: Not on file  Other Topics Concern  . Not on file  Social History Narrative  . Not on file   Additional Social History:                         Sleep: Fair  Appetite:  Fair  Current Medications: Current Facility-Administered Medications  Medication Dose Route Frequency  Provider Last Rate Last Dose  . acetaminophen (TYLENOL) tablet 650 mg  650 mg Oral Q6H PRN Catalina Gravel, NP   650 mg at 11/17/18 0819  . alum & mag hydroxide-simeth (MAALOX/MYLANTA) 200-200-20 MG/5ML suspension 30 mL  30 mL Oral Q4H PRN Thomspon, Adela Lank, NP      . feeding supplement (ENSURE ENLIVE) (ENSURE ENLIVE) liquid 237 mL  237 mL Oral BID BM Clapacs, John T, MD   237 mL at 11/17/18 1036  . hydrOXYzine (ATARAX/VISTARIL) tablet 25 mg  25 mg Oral Q6H PRN Catalina Gravel, NP   25 mg at 11/16/18 1953  . ibuprofen (ADVIL,MOTRIN) tablet 400 mg  400 mg Oral Q6H PRN Catalina Gravel, NP      . lisinopril (PRINIVIL,ZESTRIL) tablet 10 mg  10 mg Oral Daily Clapacs, Jackquline Denmark, MD   10 mg at  11/17/18 0815  . loratadine (CLARITIN) tablet 10 mg  10 mg Oral Daily Catalina Gravel, NP   10 mg at 11/17/18 0815  . LORazepam (ATIVAN) tablet 2 mg  2 mg Oral Q4H PRN Clapacs, Jackquline Denmark, MD   2 mg at 11/16/18 1953  . magnesium hydroxide (MILK OF MAGNESIA) suspension 30 mL  30 mL Oral Daily PRN Catalina Gravel, NP      . multivitamin with minerals tablet 1 tablet  1 tablet Oral Daily Clapacs, Jackquline Denmark, MD   1 tablet at 11/17/18 0815  . QUEtiapine (SEROQUEL) tablet 25 mg  25 mg Oral Q6H PRN Thalia Party, MD   25 mg at 11/14/18 0940  . QUEtiapine (SEROQUEL) tablet 400 mg  400 mg Oral QHS Clapacs, John T, MD   200 mg at 11/16/18 2157  . risperiDONE (RISPERDAL M-TABS) disintegrating tablet 1 mg  1 mg Oral Q6H PRN Clapacs, Jackquline Denmark, MD   1 mg at 11/16/18 0817  . sertraline (ZOLOFT) tablet 50 mg  50 mg Oral Daily Clapacs, Jackquline Denmark, MD   50 mg at 11/17/18 0815  . traZODone (DESYREL) tablet 50 mg  50 mg Oral QHS Catalina Gravel, NP   50 mg at 11/17/18 0431    Lab Results: No results found for this or any previous visit (from the past 48 hour(s)).  Blood Alcohol level:  Lab Results  Component Value Date   ETH <10 11/09/2018    Metabolic Disorder Labs: No results found for: HGBA1C, MPG No results found for: PROLACTIN No results found for: CHOL, TRIG, HDL, CHOLHDL, VLDL, LDLCALC  Physical Findings: AIMS: Facial and Oral Movements Muscles of Facial Expression: None, normal Lips and Perioral Area: None, normal Jaw: None, normal Tongue: None, normal,Extremity Movements Upper (arms, wrists, hands, fingers): None, normal Lower (legs, knees, ankles, toes): None, normal, Trunk Movements Neck, shoulders, hips: None, normal, Overall Severity Severity of abnormal movements (highest score from questions above): None, normal Incapacitation due to abnormal movements: None, normal Patient's awareness of abnormal movements (rate only patient's report): No Awareness, Dental Status Current problems  with teeth and/or dentures?: No Does patient usually wear dentures?: No  CIWA:    COWS:  COWS Total Score: 6  Musculoskeletal: Strength & Muscle Tone: within normal limits Gait & Station: normal Patient leans: N/A  Psychiatric Specialty Exam: Physical Exam  Nursing note and vitals reviewed. Constitutional: She appears well-developed and well-nourished.  HENT:  Head: Normocephalic and atraumatic.  Eyes: Pupils are equal, round, and reactive to light. Conjunctivae are normal.  Neck: Normal range of motion.  Cardiovascular: Normal heart sounds.  Respiratory: Effort normal.  GI: Soft.  Musculoskeletal:  Normal range of motion.  Neurological: She is alert.  Skin: Skin is warm and dry.  Psychiatric: Her affect is blunt. Her speech is delayed. She is slowed. Thought content is paranoid. Thought content is not delusional. Cognition and memory are impaired. She expresses impulsivity. She expresses no homicidal and no suicidal ideation.    Review of Systems  Constitutional: Negative.   HENT: Negative.   Eyes: Negative.   Respiratory: Negative.   Cardiovascular: Negative.   Gastrointestinal: Negative.   Musculoskeletal: Negative.   Skin: Negative.   Neurological: Negative.   Psychiatric/Behavioral: Negative.     Blood pressure 127/75, pulse 98, temperature 97.8 F (36.6 C), temperature source Oral, resp. rate 17, height 5\' 1"  (1.549 m), weight 68.9 kg, last menstrual period 11/09/2018, SpO2 99 %.Body mass index is 28.72 kg/m.  General Appearance: Disheveled  Eye Contact:  Fair  Speech:  Slow  Volume:  Decreased  Mood:  Euthymic  Affect:  Constricted  Thought Process:  Coherent  Orientation:  Full (Time, Place, and Person)  Thought Content:  Logical  Suicidal Thoughts:  No  Homicidal Thoughts:  No  Memory:  Immediate;   Fair Recent;   Fair Remote;   Fair  Judgement:  Impaired  Insight:  Shallow  Psychomotor Activity:  Normal  Concentration:  Concentration: Fair  Recall:   [employer] of Knowledge:  Fair  Language:  Fair  Akathisia:  No  Handed:  Right  AIMS (if indicated):     Assets:  Desire for Improvement Financial Resources/Insurance Housing Physical Health Resilience Social Support  ADL's:  Impaired  Cognition:  Impaired,  Mild  Sleep:  Number of Hours: 7     Treatment Plan Summary: Daily contact with patient to assess and evaluate symptoms and progress in treatment, Medication management and Plan Patient clearly seems to have shown some improvement however she is still slow in her thinking shows a paranoid cast to how she is thinking somewhat irritable.  Some of this is to be expected under the circumstance.  I informed her however that given how much trouble she was having with psychosis as recently as a day and a half ago I do not feel that it would be clinically appropriate to discharge her today.  I reassured her that she has made great improvement and that there is a reasonably good chance she might be ready for discharge by Monday.  Patient did not find this very satisfying.  No indication at this point to change medicine.  I will try to contact her husband again today for an update.  Possible discharge by Monday if she is doing very well over the weekend  Mordecai Rasmussen, MD 11/17/2018, 4:30 PM

## 2018-11-17 NOTE — [facility] Group Notes (Signed)
Feelings Around Relapse 11/17/2018 1PM  Type of Therapy and Topic:  Group Therapy:  Feelings around Relapse and Recovery  Participation Level:  Active   Description of Group:    Patients in this group will discuss emotions they experience before and after a relapse. They will process how experiencing these feelings, or avoidance of experiencing them, relates to having a relapse. Facilitator will guide patients to explore emotions they have related to recovery. Patients will be encouraged to process which emotions are more powerful. They will be guided to discuss the emotional reaction significant others in their lives may have to patients' relapse or recovery. Patients will be assisted in exploring ways to respond to the emotions of others without this contributing to a relapse.  Therapeutic Goals: 1. Patient will identify two or more emotions that lead to a relapse for them 2. Patient will identify two emotions that result when they relapse 3. Patient will identify two emotions related to recovery 4. Patient will demonstrate ability to communicate their needs through discussion and/or role plays   Summary of Patient Progress:  Actively and appropriately engaged in the group. Patient was able to provide support and validation to other group members.Patient practiced active listening when interacting with the facilitator and other group members. Patient demonstrated insight and interacted appropriately with members. Patient reports she likes to journal as a coping mechanism.     Therapeutic Modalities:   Cognitive Behavioral Therapy Solution-Focused Therapy Assertiveness Training Relapse Prevention Therapy   Suzan Slick, LCSW 11/17/2018 2:11 PM

## 2018-11-18 MED ORDER — SERTRALINE HCL 25 MG PO TABS
50.0000 mg | ORAL_TABLET | Freq: Every day | ORAL | Status: DC
  Administered 2018-11-20 – 2018-11-23 (×4): 50 mg via ORAL
  Filled 2018-11-18 (×5): qty 2

## 2018-11-18 MED ORDER — LORAZEPAM 1 MG PO TABS
1.0000 mg | ORAL_TABLET | ORAL | Status: DC | PRN
  Administered 2018-11-19 – 2018-11-25 (×7): 1 mg via ORAL
  Filled 2018-11-18 (×9): qty 1

## 2018-11-18 NOTE — Progress Notes (Signed)
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   She is seen and chart reviewed.  She reports feeling better, no SI. But when asked if she feels safe on the unit, she said "not all the time!" but also acknowledged that she needs to "work on my trust issues".   Overall, she is calm and cooperative and appropriate.  She believes that zoloft in AM makes her sleepy thus, agreed to change to PM dose.   She has been using PRN atian 2mg  2x day nearly every day.   Principal Problem: Brief reactive psychosis (HCC) Diagnosis: Principal Problem:   Brief reactive psychosis (HCC) Active Problems:   Depression   Anxiety state  Total Time spent with patient: 20 minutes  Past Psychiatric History: Patient has no significant past psychiatric history although the husband told me yesterday that Ashley Griffin thinks that in retrospect she may have had some symptoms of depression ever since the birth of their most recent childhood  Past Medical History: History reviewed. No pertinent past medical history. History reviewed. No pertinent surgical history. Family History: History reviewed. No pertinent family history. Family Psychiatric  History: See previous Social History:  Social History   Substance and Sexual Activity  Alcohol Use Not Currently  . Frequency: Never     Social History   Substance and Sexual Activity  Drug Use Not on file    Social History   Socioeconomic History  . Marital status: Married    Spouse name: Not on file  . Number of children: Not on file  . Years of education: Not on file  . Highest education level: Not on file  Occupational History  . Not on file  Social Needs  . Financial resource strain: Not on file  . Food insecurity:    Worry: Not on file    Inability: Not on file  . Transportation needs:    Medical: Not on file    Non-medical: Not on file  Tobacco Use   . Smoking status: Never Smoker  . Smokeless tobacco: Never Used  Substance and Sexual Activity  . Alcohol use: Not Currently    Frequency: Never  . Drug use: Not on file  . Sexual activity: Not on file  Lifestyle  . Physical activity:    Days per week: Not on file    Minutes per session: Not on file  . Stress: Not on file  Relationships  . Social connections:    Talks on phone: Not on file    Gets together: Not on file    Attends religious service: Not on file    Active member of club or organization: Not on file    Attends meetings of clubs or organizations: Not on file    Relationship status: Not on file  Other Topics Concern  . Not on file  Social History Narrative  . Not on file   Additional Social History:   Sleep: Fair  Appetite:  Fair  Current Medications: Current Facility-Administered Medications  Medication Dose Route Frequency Provider Last Rate Last Dose  . acetaminophen (TYLENOL) tablet 650 mg  650 mg Oral Q6H PRN Catalina Gravel, NP   650 mg at 11/17/18 0819  . alum & mag hydroxide-simeth (MAALOX/MYLANTA) 200-200-20 MG/5ML suspension 30 mL  30 mL Oral Q4H PRN Thomspon, Adela Lank, NP      . feeding supplement (ENSURE ENLIVE) (ENSURE ENLIVE) liquid 237 mL  237 mL Oral BID BM Clapacs, John T, MD   237 mL at 11/18/18 0940  . hydrOXYzine (ATARAX/VISTARIL) tablet 25 mg  25 mg Oral Q6H PRN Catalina Gravel, NP   25 mg at 11/17/18 2122  . ibuprofen (ADVIL,MOTRIN) tablet 400 mg  400 mg Oral Q6H PRN Catalina Gravel, NP   400 mg at 11/18/18 0755  . lisinopril (PRINIVIL,ZESTRIL) tablet 10 mg  10 mg Oral Daily Clapacs, Jackquline Denmark, MD   10 mg at 11/18/18 0757  . loratadine (CLARITIN) tablet 10 mg  10 mg Oral Daily Catalina Gravel, NP   10 mg at 11/18/18 0756  . LORazepam (ATIVAN) tablet 2 mg  2 mg Oral Q4H PRN Clapacs, Jackquline Denmark, MD   2 mg at 11/17/18 2123  . magnesium hydroxide (MILK OF MAGNESIA) suspension 30 mL  30 mL Oral Daily PRN Catalina Gravel, NP       . multivitamin with minerals tablet 1 tablet  1 tablet Oral Daily Clapacs, Jackquline Denmark, MD   1 tablet at 11/18/18 0756  . QUEtiapine (SEROQUEL) tablet 25 mg  25 mg Oral Q6H PRN Thalia Party, MD   25 mg at 11/14/18 0940  . QUEtiapine (SEROQUEL) tablet 400 mg  400 mg Oral QHS Clapacs, Jackquline Denmark, MD   400 mg at 11/17/18 2122  . risperiDONE (RISPERDAL M-TABS) disintegrating tablet 1 mg  1 mg Oral Q6H PRN Clapacs, Jackquline Denmark, MD   1 mg at 11/18/18 0755  . sertraline (ZOLOFT) tablet 50 mg  50 mg Oral Daily Clapacs, Jackquline Denmark, MD   50 mg at 11/18/18 0755  . traZODone (DESYREL) tablet 50 mg  50 mg Oral QHS Catalina Gravel, NP   50 mg at 11/17/18 2124    Lab Results: No results found for this or any previous visit (from the past 48 hour(s)).  Blood Alcohol level:  Lab Results  Component Value Date   ETH <10 11/09/2018    Metabolic Disorder Labs: No results found for: HGBA1C, MPG No results found for: PROLACTIN No results found for: CHOL, TRIG, HDL, CHOLHDL, VLDL, LDLCALC  Physical Findings: AIMS: Facial and Oral Movements Muscles of Facial Expression: None, normal Lips and Perioral Area: None, normal Jaw: None, normal Tongue: None, normal,Extremity Movements Upper (arms, wrists, hands, fingers): None, normal Lower (legs, knees, ankles, toes): None, normal, Trunk Movements Neck, shoulders, hips: None, normal, Overall Severity Severity of abnormal movements (highest score from questions above): None, normal Incapacitation due to abnormal movements: None, normal Patient's awareness of abnormal movements (rate only patient's report): No Awareness, Dental Status Current problems with teeth and/or dentures?: No Does patient usually wear dentures?: No  CIWA:    COWS:  COWS Total Score: 6  Musculoskeletal: Strength & Muscle Tone: within normal limits Gait & Station: normal Patient leans: N/A  Psychiatric Specialty Exam: Physical Exam  Nursing note and vitals reviewed. Constitutional: She  appears well-developed and well-nourished.  HENT:  Head: Normocephalic and atraumatic.  Eyes: Pupils are equal, round, and reactive to light. Conjunctivae are normal.  Neck: Normal range of motion.  Cardiovascular: Normal heart sounds.  Respiratory: Effort normal.  GI: Soft.  Musculoskeletal: Normal range of motion.  Neurological: She is alert.  Skin: Skin is warm and dry.  Psychiatric: Her affect is blunt. Her speech is delayed. She is slowed. Thought content is paranoid. Thought content is not delusional. Cognition and memory are impaired. She expresses impulsivity. She expresses no homicidal and no suicidal ideation.    Review of Systems  Constitutional:  Negative.   HENT: Negative.   Eyes: Negative.   Respiratory: Negative.   Cardiovascular: Negative.   Gastrointestinal: Negative.   Musculoskeletal: Negative.   Skin: Negative.   Neurological: Negative.   Psychiatric/Behavioral: Negative.     Blood pressure 127/90, pulse (!) 113, temperature 98.4 F (36.9 C), temperature source Oral, resp. rate 15, height 5\' 1"  (1.549 m), weight 68.9 kg, last menstrual period 11/09/2018, SpO2 98 %.Body mass index is 28.72 kg/m.  General Appearance: Casual  Eye Contact:  Fair  Speech:  Clear and Coherent and Normal Rate  Volume:  Decreased  Mood:  Depressed  Affect:  Constricted  Thought Process:  Coherent  Orientation:  Full (Time, Place, and Person)  Thought Content:  Logical  Suicidal Thoughts:  No  Homicidal Thoughts:  No  Memory:  Immediate;   Fair Recent;   Fair Remote;   Fair  Judgement:  Impaired  Insight:  Shallow  Psychomotor Activity:  Normal  Concentration:  Concentration: Fair  Recall:  [employer]  Fund of Knowledge:  Fair  Language:  Fair  Akathisia:  No  Handed:  Right  AIMS (if indicated):     Assets:  Desire for Improvement Financial Resources/Insurance Housing Physical Health Resilience Social Support  ADL's:  Impaired  Cognition:  Impaired,  Mild  Sleep:   Number of Hours: 6     Treatment Plan Summary: Daily contact with patient to assess and evaluate symptoms and progress in treatment, Medication management and Plan Patient clearly seems to have shown some improvement however she is still slow in her thinking shows a paranoid cast to how she is thinking somewhat irritable.  Some of this is to be expected under the circumstance.  I informed her however that given how much trouble she was having with psychosis as recently as a day and a half ago I do not feel that it would be clinically appropriate to discharge her today.  I reassured her that she has made great improvement and that there is a reasonably good chance she might be ready for discharge by Monday.  Patient did not find this very satisfying.  No indication at this point to change medicine.  I will try to contact her husband again today for an update.  Possible discharge by Monday if she is doing very well over the weekend   # MDD with psychotic features -- continue zoloft 50mg , but change to qhs per pt's request due to subjective sedattion.  -- continues seroquel 400mg  qhs.  QTc is 420 -- continue risperdal 1mg  q6hr PRN. But will stop seroquel prn.  -- reduce ativan prn to 1mg  q4h prn.  -- continue Trazodone 50mg  qhs prn.   -- continue sitter for now.   # Dispo: -- defer to primary team.   Johnnette Laux, MD 11/18/2018, 11:44 AM

## 2018-11-18 NOTE — Progress Notes (Signed)
Safety Notes:  0800: Patient sitting on the side of her bed eating her breakfast. Sitter at her side. 0900: Patient in bed resting with her eyes closed. Sitter at her side. 1000: Patient in the hall with her sitter stating, "I can't find my clothes." This Clinical research associate and other staff searched for items. 1100: Patient in the dayroom interacting with select peers. 1200: Patient eating lunch in her room. 1300: Patient on the telephone speaking with her husband.Sitter within arms reach. 1400: Patient outside with staff and other peers. Selective interaction noted. 1500: Patient outside with sitter at this time, minimal interaction with peers noted. 1600: Patient taken off of 1:1. Some paranoia still exhibited but patient is not endorsing SI.

## 2018-11-18 NOTE — Progress Notes (Signed)
Patient is present in the milieu with safety sitter at her side. Pacing the unit wanting to call her husband when the phones are off. States, "I can't find my clothes. Someone probably throw them away by accident because I put them in a brown paper bag." Staff looked for clothes in the laundry room, pt lockers and other rooms that pt was assigned to with no results. Compliant with treatment plan and reports that she didn't sleep too well last night. Past encouraged to rest this morning after breakfast. Milieu remains safe with q 15 minute safety checks.

## 2018-11-18 NOTE — Plan of Care (Signed)
  Problem: Education: Goal: Utilization of techniques to improve thought processes will improve Outcome: Progressing  Thought process is improving no distress noted .

## 2018-11-18 NOTE — Plan of Care (Signed)
Patient has shown the ability to verbalize concerns and needs to staff. Participates in group activities and remains safe on the unit with q 15 minute safety checks. Milieu remains safe with q 15 minute safety checks.

## 2018-11-18 NOTE — [facility] Group Notes (Signed)
LCSW Group Therapy Note  11/18/2018 1:15pm  Type of Therapy and Topic:  Group Therapy:  Cognitive Distortions  Participation Level:  Active   Description of Group:    Patients in this group will be introduced to the topic of cognitive distortions.  Patients will identify and describe cognitive distortions, describe the feelings these distortions create for them.  Patients will identify one or more situations in their personal life where they have cognitively distorted thinking and will verbalize challenging this cognitive distortion through positive thinking skills.  Patients will practice the skill of using positive affirmations to challenge cognitive distortions using affirmation cards.    Therapeutic Goals:  1. Patient will identify two or more cognitive distortions they have used 2. Patient will identify one or more emotions that stem from use of a cognitive distortion 3. Patient will demonstrate use of a positive affirmation to counter a cognitive distortion through discussion and/or role play. 4. Patient will describe one way cognitive distortions can be detrimental to wellness   Summary of Patient Progress: The patient reported that she  feels "better." Patients were introduced to the topic of cognitive distortions.Thepatient was able to identify and describe cognitive distortions, described the feelings these distortions create for her. Patient identified a situation in her personal life where she has cognitively distorted thinking and was able to verbalize and challenged this cognitive distortion through positive thinking skills.Patient was able to provide support and validation to other group members.     Therapeutic Modalities:   Cognitive Behavioral Therapy Motivational Interviewing   Karlei Waldo  CUEBAS-COLON, LCSW 11/18/2018 10:27 AM

## 2018-11-18 NOTE — Progress Notes (Signed)
Patient alert and oriented x 4 with periods of   confusion to situation., patient continues to be on 1:1, sitter at bedside, nok distress noted, affect is bright, thoughts are disorganized and incoherent, she follows simple command and she is receptive to staff. No distress noted , 15 minutes safety checks maintained will continue to monitor.

## 2018-11-18 NOTE — Progress Notes (Signed)
Patient continues to be on 1:1 no distress noted, receptive to staff, sitter at bedside will continue to monitor closely.

## 2018-11-19 NOTE — Progress Notes (Signed)
D: Patient still appears fearful. Asked if Roger (her husband) was with the "senior saint". Then explained that the "senior saint" was an elderly female peer on the unit. Very slow to take her medication, stating "it burns me up". Pointed out it was hot in her room and adjusted the temperature. Denies SI, HI and AV  hallucinations.  A: Continue to monitor and offer support R: Safety maintained.  

## 2018-11-19 NOTE — Plan of Care (Signed)
D: Patient remains on 1:1 for safety due to impulsivity and psychosis. Her mood and affect are anxious. Her thought content is significant for ideas of reference, religious preoccupation and paranoia. She claims she is pregnant with [place]. She claimed she saw her psychiatrist's wife here in the hospital. She said the security officer was Fredrik Cove her husband. She said that she and this author needed to "'procreate". She claims that I am her children's godmother. She is fixated on her psychiatrist at [facility] being her advocate. Disoriented to place, time, and situation. Oriented to person only. A: Continue to monitor 1:1 for safety. R: Safety maintained.

## 2018-11-19 NOTE — [facility] Group Notes (Signed)
[facility] Group Notes:  (Nursing/MHT/Case Management/Adjunct)  Date:  11/19/2018  Time:  12:04 AM  Type of Therapy:  Group Therapy  Participation Level:  Did Not Attend    Mayra Neer 11/19/2018, 12:04 AM

## 2018-11-19 NOTE — Progress Notes (Signed)
1:1 Safety Sitter note  1500:Patient placed on 1:1 for increased psychosis including paranoia and frequent redirection.  1600: Patient in the medication room with this nurse. Sitter at her side. Patient very tearful because she thinks we have been giving her medications that she is allergic to. Explained to patient. That we have not giving her any medications that she is allergic to. 1700: Patient in the community room eating her dinner. Sitter at her side. 1800: Patient in the dayroom interaction with select peers. Sitter at her. 39:  Patient remains in the dayroom with her sitter at her side.

## 2018-11-19 NOTE — B[facility] Counselor (Addendum)
CSW received call from patient's husband-Roger, requesting update about patient. CSW informed Fredrik Cove that the patient was very confused and paranoid in the morning. Additionally, the patient complained about breast and stomach pain. The patient shared with the CSW that she has trust issues. A few minutes later the patient apologized to the CSW and nurses about stating that she didn't feel safe with her husband, she clarified that she felt safe with her husband that she was confused. CSW informed Fredrik Cove she was unable to assess patient's thoughts and feelings about the [place] and [place]. The patient had disorganized thinking, very confused, paranoid and thinking that she was having "crazy thoughts." CSW informed Fredrik Cove that the patient appeared stable during the therapy group, she actively participated and provided supportive feedback to others in the group. Fredrik Cove stated that they found out that the patient's paternal grandmother died in her mid fortys due to having a mental illness, after receiving an autopsy it was discovered she had a brain tumor. Fredrik Cove stated that he is concern about his wife and that maybe this information could lead into something else. CSW informed Fredrik Cove that she would forward this information the the patient's psychiatrist.    Johnnye Sima, LCSW  11/19/2018

## 2018-11-19 NOTE — Progress Notes (Addendum)
1:1 Safety Sitter note  2000 D: Patient remains on 1:1 for safety due to impulsivity, paranoia, and increased psychosis requiring frequent redirection. Her mood and affect are anxious. Her thought content is significant for ideas of reference, religious preoccupation and paranoia. She claims she is pregnant with [place]. She claimed she saw her psychiatrist's wife here in the hospital. She said the security officer was Fredrik Cove her husband. She said that she and this author needed to "'procreate". She claims that I am her children's godmother. She is fixated on her psychiatrist at [facility] being her advocate. Disoriented to place, time, and situation. Oriented to person only. A: Continue to monitor 1:1 for safety. R: Safety maintained. 2100 Patient remains on 1:1 with sitter by her side. Pacing around the unit. Appears anxious and worried. Continues to claim she is pregnant. 2200 Patient has taken a shower. Talking about needing "our husbands, our families and all our doctors". Tearful. After taking a shower, stood in the shower, naked, complaining of being cold, but needed redirection to put on her clothes.Continues on 1:1. Safety maintained. 2300 Patient refused all medications, continues to be floridly psychotic,, stating that she died and that the church is rising up from down in the earth, thinks one of the MHT's is Jesus and the other one is Gavin Pound the judge from the bible. Continues on 1:1. Safety maintained. 0000 Patient has been awake. Saying that the building is coming down. Continues to have 1:1 sitter by her side. 0100 Patient continues to be on 1:1. Awake in bed. Safety maintained. 0200 Patient awake and asking for her blood pressure medication Lisinopril because her ears were popping. Convinced patient to take hydroxyzine 25 mg, Ativan 1 mg, and Risperdal M-tab 1 mg. Continues to be psychotic and perseverate about being pregnant, and procreating to populate the earth and wanting to see her  advocateSafety maintained. 0300 Patient is asleep. Continues on 1:1. Safety maintained.  0400 Patient continues to sleep. Continues on 1:1 Safety maintained. 0500 Patient continues to sleep. Continues on 1:1. Safety maintained. 0600 Patient is awake. Calling various female staff members "Fredrik Cove", referring to her husband. She is also saying she is "the bride of Christ" and referring to a female MHT as "Jesus". 1:1 continues. Safety maintained. 0700 Patient continues on 1:1. Smiling, then crying. Referring to various staff members as biblical characters. Safety maintained.

## 2018-11-19 NOTE — Plan of Care (Signed)
D: Patient still appears fearful. Asked if Fredrik Cove (her husband) was with the "senior saint". Then explained that the "senior saint" was an elderly female peer on the unit. Very slow to take her medication, stating "it burns me up". Pointed out it was hot in her room and adjusted the temperature. Denies SI, HI and AV  hallucinations.  A: Continue to monitor and offer support R: Safety maintained.

## 2018-11-19 NOTE — Plan of Care (Addendum)
Patient is knowledgeable of her medications and the side effects of each medication. Verbalizes her feelings to staff. Milieu remains safe with q 15 minute safety checks and 1:1 sitter.

## 2018-11-19 NOTE — Progress Notes (Signed)
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   She is seen and chart reviewed. She is awake, alert, but more paranoid and confuse. She said that she is afraid of going to bathroom. But when asked what she is afraid of, she doesn'Griffin know.   RN: she continues to be very needy, without the sitter, she is more confused and paranoid. Thus, sitter is restarted today.   Principal Problem: Brief reactive psychosis (HCC) Diagnosis: Principal Problem:   Brief reactive psychosis (HCC) Active Problems:   Depression   Anxiety state  Total Time spent with patient: 20 minutes  Past Psychiatric History: Patient has no significant past psychiatric history although the husband told me yesterday that Ashley Griffin thinks that in retrospect she may have had some symptoms of depression ever since the birth of their most recent childhood  Past Medical History: History reviewed. No pertinent past medical history. History reviewed. No pertinent surgical history. Family History: History reviewed. No pertinent family history. Family Psychiatric  History: See previous Social History:  Social History   Substance and Sexual Activity  Alcohol Use Not Currently  . Frequency: Never     Social History   Substance and Sexual Activity  Drug Use Not on file    Social History   Socioeconomic History  . Marital status: Married    Spouse name: Not on file  . Number of children: Not on file  . Years of education: Not on file  . Highest education level: Not on file  Occupational History  . Not on file  Social Needs  . Financial resource strain: Not on file  . Food insecurity:    Worry: Not on file    Inability: Not on file  . Transportation needs:    Medical: Not on file    Non-medical: Not on file  Tobacco Use  . Smoking status: Never Smoker  . Smokeless tobacco: Never Used  Substance and Sexual Activity   . Alcohol use: Not Currently    Frequency: Never  . Drug use: Not on file  . Sexual activity: Not on file  Lifestyle  . Physical activity:    Days per week: Not on file    Minutes per session: Not on file  . Stress: Not on file  Relationships  . Social connections:    Talks on phone: Not on file    Gets together: Not on file    Attends religious service: Not on file    Active member of club or organization: Not on file    Attends meetings of clubs or organizations: Not on file    Relationship status: Not on file  Other Topics Concern  . Not on file  Social History Narrative  . Not on file   Additional Social History:   Sleep: Fair  Appetite:  Fair  Current Medications: Current Facility-Administered Medications  Medication Dose Route Frequency Provider Last Rate Last Dose  . acetaminophen (TYLENOL) tablet 650 mg  650 mg Oral Q6H PRN Ashley Gravel, NP   650 mg at 11/17/18 0819  . alum & mag hydroxide-simeth (MAALOX/MYLANTA) 200-200-20 MG/5ML suspension 30 mL  30 mL Oral Q4H PRN Ashley Griffin, Ashley Lank, NP      . feeding supplement (ENSURE ENLIVE) (ENSURE ENLIVE) liquid 237 mL  237 mL Oral BID BM Clapacs, Ashley T, MD   237 mL at 11/19/18 1030  . hydrOXYzine (ATARAX/VISTARIL) tablet 25  mg  25 mg Oral Q6H PRN Ashley Gravel, NP   25 mg at 11/17/18 2122  . ibuprofen (ADVIL,MOTRIN) tablet 400 mg  400 mg Oral Q6H PRN Ashley Gravel, NP   400 mg at 11/18/18 0755  . lisinopril (PRINIVIL,ZESTRIL) tablet 10 mg  10 mg Oral Daily Clapacs, Ashley Denmark, MD   10 mg at 11/19/18 0801  . loratadine (CLARITIN) tablet 10 mg  10 mg Oral Daily Ashley Gravel, NP   10 mg at 11/19/18 0834  . LORazepam (ATIVAN) tablet 1 mg  1 mg Oral Q4H PRN Ashley Dau, MD   1 mg at 11/19/18 0933  . magnesium hydroxide (MILK OF MAGNESIA) suspension 30 mL  30 mL Oral Daily PRN Ashley Gravel, NP      . multivitamin with minerals tablet 1 tablet  1 tablet Oral Daily Clapacs, Ashley Denmark, MD   1 tablet at  11/19/18 0801  . QUEtiapine (SEROQUEL) tablet 400 mg  400 mg Oral QHS Clapacs, Ashley T, MD   400 mg at 11/18/18 2140  . risperiDONE (RISPERDAL M-TABS) disintegrating tablet 1 mg  1 mg Oral Q6H PRN Clapacs, Ashley Denmark, MD   1 mg at 11/18/18 0755  . sertraline (ZOLOFT) tablet 50 mg  50 mg Oral QHS Ashley Dudgeon, MD      . traZODone (DESYREL) tablet 50 mg  50 mg Oral QHS Ashley Gravel, NP   50 mg at 11/18/18 2140    Lab Results: No results found for this or any previous visit (from the past 48 hour(s)).  Blood Alcohol level:  Lab Results  Component Value Date   ETH <10 11/09/2018    Metabolic Disorder Labs: No results found for: HGBA1C, MPG No results found for: PROLACTIN No results found for: CHOL, TRIG, HDL, CHOLHDL, VLDL, LDLCALC  Physical Findings: AIMS: Facial and Oral Movements Muscles of Facial Expression: None, normal Lips and Perioral Area: None, normal Jaw: None, normal Tongue: None, normal,Extremity Movements Upper (arms, wrists, hands, fingers): None, normal Lower (legs, knees, ankles, toes): None, normal, Trunk Movements Neck, shoulders, hips: None, normal, Overall Severity Severity of abnormal movements (highest score from questions above): None, normal Incapacitation due to abnormal movements: None, normal Patient's awareness of abnormal movements (rate only patient's report): No Awareness, Dental Status Current problems with teeth and/or dentures?: No Does patient usually wear dentures?: No  CIWA:    COWS:  COWS Total Score: 6  Musculoskeletal: Strength & Muscle Tone: within normal limits Gait & Station: normal Patient leans: N/A  Psychiatric Specialty Exam: Physical Exam  Nursing note and vitals reviewed. Constitutional: She appears well-developed and well-nourished.  HENT:  Head: Normocephalic and atraumatic.  Eyes: Pupils are equal, round, and reactive to light. Conjunctivae are normal.  Neck: Normal range of motion.  Cardiovascular: Normal heart sounds.   Respiratory: Effort normal.  GI: Soft.  Musculoskeletal: Normal range of motion.  Neurological: She is alert.  Skin: Skin is warm and dry.  Psychiatric: Her affect is blunt. Her speech is delayed. She is slowed. Thought content is paranoid. Thought content is not delusional. Cognition and memory are impaired. She expresses impulsivity. She expresses no homicidal and no suicidal ideation.    Review of Systems  Constitutional: Negative.   HENT: Negative.   Eyes: Negative.   Respiratory: Negative.   Cardiovascular: Negative.   Gastrointestinal: Negative.   Musculoskeletal: Negative.   Skin: Negative.   Neurological: Negative.   Psychiatric/Behavioral: Negative.     Blood pressure (!) 140/91, pulse 97, temperature 98.2 F (  36.8 C), temperature source Oral, resp. rate 16, height 5\' 1"  (1.549 m), weight 68.9 kg, last menstrual period 11/09/2018, SpO2 99 %.Body mass index is 28.72 kg/m.  General Appearance: Casual  Eye Contact:  Fair  Speech:  Clear and Coherent and Normal Rate  Volume:  Decreased  Mood:  Depressed  Affect:  Constricted  Thought Process:  Coherent  Orientation:  Full (Time, Place, and Person)  Thought Content:  Paranoid Ideation  Suicidal Thoughts:  No  Homicidal Thoughts:  No  Memory:  Immediate;   Fair Recent;   Fair Remote;   Fair  Judgement:  Impaired  Insight:  Shallow  Psychomotor Activity:  Normal  Concentration:  Concentration: Fair  Recall:  [employer] of Knowledge:  Fair  Language:  Fair  Akathisia:  No  Handed:  Right  AIMS (if indicated):     Assets:  Desire for Improvement Financial Resources/Insurance Housing Physical Health Resilience Social Support  ADL's:  Impaired  Cognition:  Impaired,  Mild  Sleep:  Number of Hours: 5.5     Treatment Plan Summary: Daily contact with patient to assess and evaluate symptoms and progress in treatment, Medication management and Plan Patient clearly seems to have shown some improvement however she  is still slow in her thinking shows a paranoid cast to how she is thinking somewhat irritable.  Some of this is to be expected under the circumstance.  I informed her however that given how much trouble she was having with psychosis as recently as a day and a half ago I do not feel that it would be clinically appropriate to discharge her today.  I reassured her that she has made great improvement and that there is a reasonably good chance she might be ready for discharge by Monday.  Patient did not find this very satisfying.  No indication at this point to change medicine.  I will try to contact her husband again today for an update.  Possible discharge by Monday if she is doing very well over the weekend   # MDD with psychotic features -- continue zoloft 50mg , but change to qhs per pt's request due to subjective sedattion.  -- continues seroquel 400mg  qhs.  QTc is 420 -- continue risperdal 1mg  q6hr PRN. But will stop seroquel prn. Pt uses PRN risperdal 1mg  once a day.  Consider switch standing antipsychotic Seroquel to risperdal if seroquel is not effective. Will defer to primary team.  -- reduce ativan prn to 1mg  q4h prn. She uses daily.  -- continue Trazodone 50mg  qhs prn.   -- continue sitter was off overnight, but pt could not tolerate it. Thus, restart sitter today.   # Dispo: -- defer to primary team.   Cyla Haluska, MD 11/19/2018, 1:47 PM

## 2018-11-19 NOTE — [facility] Group Notes (Signed)
LCSW Group Therapy Note 11/19/2018 1:15pm  Type of Therapy and Topic: Group Therapy: Feelings Around Returning Home & Establishing a Supportive Framework and Supporting Oneself When Supports Not Available  Participation Level: Active  Description of Group:  Patients first processed thoughts and feelings about upcoming discharge. These included fears of upcoming changes, lack of change, new living environments, judgements and expectations from others and overall stigma of mental health issues. The group then discussed the definition of a supportive framework, what that looks and feels like, and how do to discern it from an unhealthy non-supportive network. The group identified different types of supports as well as what to do when your family/friends are less than helpful or unavailable  Therapeutic Goals  1. Patient will identify one healthy supportive network that they can use at discharge. 2. Patient will identify one factor of a supportive framework and how to tell it from an unhealthy network. 3. Patient able to identify one coping skill to use when they do not have positive supports from others. 4. Patient will demonstrate ability to communicate their needs through discussion and/or role plays.  Summary of Patient Progress:  The patient reported she feels "tired but a little better." Pt engaged during group session. As patients processed their anxiety about discharge and described healthy supports patient shared she is ready to be discharge. She stated, "I'm just not sleeping well." She listed her family as her main support.  Patients identified at least one self-care tool they were willing to use after discharge.   Therapeutic Modalities Cognitive Behavioral Therapy Motivational Interviewing   Ashley Slevin  CUEBAS-COLON, LCSW 11/19/2018 9:06 AM

## 2018-11-20 MED ORDER — QUETIAPINE FUMARATE 200 MG PO TABS
600.0000 mg | ORAL_TABLET | Freq: Every day | ORAL | Status: DC
  Administered 2018-11-20 – 2018-11-21 (×2): 600 mg via ORAL
  Filled 2018-11-20 (×2): qty 3

## 2018-11-20 NOTE — Progress Notes (Signed)
Patient continues to disorganized and very paranoid.Patient states "I am dead.They killed me yesterday.The can see the spirits here."Patient keep on saying "I miss my family.I can not sleep here.I need to sleep besides my husband."Patient appears irritable at times.Patient stated that Risperdal helped me.Appetite and energy level good.Support and encouragement given.

## 2018-11-20 NOTE — Plan of Care (Signed)
Patient is very labile at this time. Patient remains knowledgeable regarding her medications. Attends group and actively participates. Will continue to monitor.

## 2018-11-20 NOTE — [facility] Group Notes (Signed)
LCSW Group Therapy Note   11/20/2018 2:06 PM   Type of Therapy and Topic:  Group Therapy:  Overcoming Obstacles   Participation Level:  Minimal   Description of Group:    In this group patients will be encouraged to explore what they see as obstacles to their own wellness and recovery. They will be guided to discuss their thoughts, feelings, and behaviors related to these obstacles. The group will process together ways to cope with barriers, with attention given to specific choices patients can make. Each patient will be challenged to identify changes they are motivated to make in order to overcome their obstacles. This group will be process-oriented, with patients participating in exploration of their own experiences as well as giving and receiving support and challenge from other group members.   Therapeutic Goals: 1. Patient will identify personal and current obstacles as they relate to admission. 2. Patient will identify barriers that currently interfere with their wellness or overcoming obstacles.  3. Patient will identify feelings, thought process and behaviors related to these barriers. 4. Patient will identify two changes they are willing to make to overcome these obstacles:      Summary of Patient Progress Pt was present in group. Pt had disorganized thoughts and made statements about another pt being at the hospital since he was 38 years old. Pt reported she is in prison and the hospital locked her up.     Therapeutic Modalities:   Cognitive Behavioral Therapy Solution Focused Therapy Motivational Interviewing Relapse Prevention Therapy  Iris Pert, MSW, LCSW Clinical Social Work 11/20/2018 2:06 PM

## 2018-11-20 NOTE — Tx Team (Signed)
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 acetaminophen (TYLENOL) tablet 650 mg  650 mg Oral Q6H PRN Catalina Gravel, NP   650 mg at 11/17/18 0819  . alum & mag hydroxide-simeth (MAALOX/MYLANTA) 200-200-20 MG/5ML suspension 30 mL  30 mL Oral Q4H PRN Thomspon, Adela Lank, NP      . feeding supplement (ENSURE ENLIVE) (ENSURE ENLIVE) liquid 237 mL  237 mL Oral BID BM Clapacs, John T, MD   237 mL at 11/19/18 1404  . hydrOXYzine (ATARAX/VISTARIL) tablet 25 mg  25 mg Oral Q6H PRN Catalina Gravel, NP   25 mg at 11/20/18 0223  . ibuprofen (ADVIL,MOTRIN) tablet 400 mg  400 mg Oral Q6H PRN Catalina Gravel, NP   400 mg at 11/18/18 0755  . lisinopril (PRINIVIL,ZESTRIL) tablet 10 mg  10 mg Oral Daily Clapacs, Jackquline Denmark, MD   10 mg at 11/20/18 0746  . loratadine (CLARITIN) tablet 10 mg  10 mg Oral Daily Catalina Gravel, NP   10 mg at 11/20/18 0746  . LORazepam (ATIVAN) tablet 1 mg  1 mg Oral Q4H PRN He, Jun, MD   1 mg at 11/20/18 0223  . magnesium hydroxide (MILK OF MAGNESIA) suspension 30 mL  30 mL Oral Daily PRN Catalina Gravel, NP      . multivitamin with minerals tablet 1 tablet  1 tablet Oral Daily Clapacs, Jackquline Denmark, MD   1 tablet at 11/20/18 0746  . QUEtiapine (SEROQUEL) tablet 400 mg  400 mg Oral QHS Clapacs, John T, MD   400 mg at 11/18/18 2140  . risperiDONE (RISPERDAL M-TABS) disintegrating tablet 1 mg  1 mg Oral Q6H PRN Clapacs, Jackquline Denmark, MD   1 mg at 11/20/18 0223  . sertraline (ZOLOFT) tablet 50 mg  50 mg Oral QHS He, Jun, MD      . traZODone (DESYREL) tablet 50 mg  50 mg Oral QHS Catalina Gravel, NP   50 mg at 11/18/18 2140   PTA Medications: Medications Prior to Admission  Medication Sig Dispense Refill Last Dose  . diltiazem (CARDIZEM) 60 MG tablet Take 60 mg by mouth every 12 (twelve) hours.     . fexofenadine (ALLEGRA) 180 MG tablet Take 180 mg by mouth as needed.     . hydrOXYzine (ATARAX/VISTARIL) 25 MG tablet Take 25 mg by mouth every 6 (six) hours as needed for sleep.   11/09/2018 at 0700  . ibuprofen (ADVIL,MOTRIN) 200 MG tablet Take 600 mg by mouth every 6 (six) hours as needed for pain.     Marland Kitchen sertraline (ZOLOFT) 50 MG tablet Take 25 mg by mouth daily.     . traZODone (DESYREL) 50 MG tablet Take 50-100 mg by mouth at bedtime.       Patient Stressors: Financial difficulties Marital or family conflict Medication change or noncompliance Occupational concerns  Patient Strengths: Average or above average Radio producer for treatment/growth Religious Affiliation  Treatment Modalities: Medication Management, Group therapy, Case management,  1 to 1 session with clinician, Psychoeducation, Recreational therapy.   Physician Treatment Plan for Primary Diagnosis: Brief reactive psychosis (HCC) Long Term Goal(s): Improvement in symptoms so as ready for discharge Improvement in symptoms  so as ready for discharge   Short Term Goals: Ability to verbalize feelings will improve Ability to demonstrate self-control will improve Ability to identify and develop effective coping behaviors will improve Compliance with prescribed medications will improve  Medication Management: Evaluate patient's response, side effects, and tolerance of medication regimen.  Therapeutic Interventions: 1 to 1 sessions, Unit Group sessions and Medication administration.  Evaluation of Outcomes: Not Progressing  Physician Treatment Plan for Secondary Diagnosis: Principal Problem:   Brief reactive psychosis (HCC) Active Problems:   Depression   Anxiety  state  Long Term Goal(s): Improvement in symptoms so as ready for discharge Improvement in symptoms so as ready for discharge   Short Term Goals: Ability to verbalize feelings will improve Ability to demonstrate self-control will improve Ability to identify and develop effective coping behaviors will improve Compliance with prescribed medications will improve     Medication Management: Evaluate patient's response, side effects, and tolerance of medication regimen.  Therapeutic Interventions: 1 to 1 sessions, Unit Group sessions and Medication administration.  Evaluation of Outcomes: Not Progressing   RN Treatment Plan for Primary Diagnosis: Brief reactive psychosis (HCC) Long Term Goal(s): Knowledge of disease and therapeutic regimen to maintain health will improve  Short Term Goals: Ability to demonstrate self-control, Ability to participate in decision making will improve, Ability to verbalize feelings will improve, Ability to disclose and discuss suicidal ideas and Ability to identify and develop effective coping behaviors will improve  Medication Management: RN will administer medications as ordered by provider, will assess and evaluate patient's response and provide education to patient for prescribed medication. RN will report any adverse and/or side effects to prescribing provider.  Therapeutic Interventions: 1 on 1 counseling sessions, Psychoeducation, Medication administration, Evaluate responses to treatment, Monitor vital signs and CBGs as ordered, Perform/monitor CIWA, COWS, AIMS and Fall Risk screenings as ordered, Perform wound care treatments as ordered.  Evaluation of Outcomes: Not Progressing   LCSW Treatment Plan for Primary Diagnosis: Brief reactive psychosis (HCC) Long Term Goal(s): Safe transition to appropriate next level of care at discharge, Engage patient in therapeutic group addressing interpersonal concerns.  Short Term Goals: Engage patient in aftercare  planning with referrals and resources, Increase social support, Increase ability to appropriately verbalize feelings, Increase emotional regulation and Facilitate acceptance of mental health diagnosis and concerns  Therapeutic Interventions: Assess for all discharge needs, 1 to 1 time with Social worker, Explore available resources and support systems, Assess for adequacy in community support network, Educate family and significant other(s) on suicide prevention, Complete Psychosocial Assessment, Interpersonal group therapy.  Evaluation of Outcomes: Not Progressing   Progress in Treatment: Attending groups: Yes. Participating in groups: Yes. Taking medication as prescribed: Yes. Toleration medication: Yes. Family/Significant other contact made: Yes, individual(s) contacted:  SPE completed with the patient's husband.   Patient understands diagnosis: No. Discussing patient identified problems/goals with staff: Yes. Medical problems stabilized or resolved: Yes. Denies suicidal/homicidal ideation: Yes. Issues/concerns per patient self-inventory: No. Other: none  New problem(s) identified: No, Describe:  none  New Short Term/Long Term Goal(s): elimination of symptoms of psychosis, medication management for mood stabilization; elimination of SI thoughts; development of comprehensive mental wellness plan.  Patient Goals:  "get better and go home.  Rest. I wasn't sleeping."  Discharge Plan or Barriers: Patient at this time is remains inappropriate for discharge. Patient remains diorganized and at times is hyper-religious and responding to internal stimuli.   Reason for Continuation of Hospitalization: Anxiety Depression Mania Medication stabilization  Estimated Length of  Stay: 1-5 days  Recreational Therapy: Patient Stressors: N/A Patient Goal: Patient will engage in groups without prompting or encouragement from LRT x3 group sessions within 5 recreation therapy group  sessions  Attendees: Patient: Ashley Griffin 11/20/2018 9:17 AM  Physician: Dr. Toni Amendlapacs, MD 11/20/2018 9:17 AM  Nursing: 11/20/2018 9:17 AM  RN Care Manager: 11/20/2018 9:17 AM  Social Worker: Penni HomansMichaela Haidyn Kilburg, LCSW 11/20/2018 9:17 AM  Recreational Therapist:  11/20/2018 9:17 AM  Other:  11/20/2018 9:17 AM  Other:  11/20/2018 9:17 AM  Other: 11/20/2018 9:17 AM    Scribe for Treatment Team: Harden MoMichaela J Jansen Sciuto, LCSW 11/20/2018 9:17 AM

## 2018-11-20 NOTE — Progress Notes (Signed)
Recreation Therapy Notes   Date: 11/20/2018  Time: 9:30 am  Location: Craft Room  Behavioral response: Appropriate,Hyper-religious, hyperactive, redirected  Intervention Topic: Problem Solving  Discussion/Intervention:  Group content on today was focused on problem solving. The group described what problem solving is. Patients expressed how problems affect them and how they deal with problems. Individuals identified healthy ways to deal with problems. Patients explained what normally happens to them when they do not deal with problems. The group expressed reoccurring problems for them. The group participated in the intervention "Ways to Solve problems" where patients were given a chance to explore different ways to solve problems.  Clinical Observations/Feedback:  Patient came to group and stated she deals with problems by feeding into others to give life. She explained how God helps fix problems and was also very focused on what her peers were doing. Individual was social with peers and staff while participating in group.  Aquanetta Schwarz LRT/CTRS          Dot Splinter 11/20/2018 11:03 AM

## 2018-11-20 NOTE — B[facility] Counselor (Signed)
CSW spoke with the patient's husband regarding the patient's continued disorganized thinking.  Patient asked about the patient's sleeping habits.  CSW encouraged the patient to check with the nursing staff.  Husband reports concerns that the patient is wandering around and CSW reported that per notes it doesn't appear the patient is wandering but that she remains disorganized.  Husband asked if workers comp has contacted this CSW and CSw reported that they have not.  Husband asked that CSW ask Dr. Toni Amend.   [name], MSW, LCSW 11/20/2018 10:57 AM

## 2018-11-20 NOTE — [facility] Counselor (Signed)
CSW stopped to check in with the patient.  Patient requested that CSW "revere my day".  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  Pt then began to request to speak to the doctor in regards to discharge.  CSW informed that Dr. Toni Amend may no longer be on the unit, however, patient could check his office.  CSW referred tech to show patient to the office.   [name], MSW, LCSW 11/20/2018 9:17 AM

## 2018-11-20 NOTE — Progress Notes (Signed)
Safety sitter notes:   0800- Patient is up in the milieu with her sitter. Observed on the phone. 0900: Patient in the bathroom at this time. Sitter in room awaiting patient to come out. No distress noted at this time. 1000: Patient in group actively participating at this time. Sitter within close proximity. 1100: Patient in the hallway ambulating and talking to the sitter. No distress noted at this time. 1200: Patient in her room eating her meal. Sitter within close proximity. 1300:Patient is in group therapy with sitter at her side. No distress noted at this time.

## 2018-11-20 NOTE — Progress Notes (Signed)
[facility] MD Progress Note  11/20/2018 5:11 PM Ashley Griffin  MRN:  [id number] Subjective: Follow-up for this patient having a psychotic disorder.  Unfortunately over the weekend she seems to have decompensated.  On interview today she was very disorganized paranoid bizarre lots of bizarre paranoid accusations.  Not sleeping well.  Irritable. Principal Problem: Brief reactive psychosis (HCC) Diagnosis: Principal Problem:   Brief reactive psychosis (HCC) Active Problems:   Depression   Anxiety state  Total Time spent with patient: 30 minutes  Past Psychiatric History: Patient has no real significant past psychiatric history  Past Medical History: History reviewed. No pertinent past medical history. History reviewed. No pertinent surgical history. Family History: History reviewed. No pertinent family history. Family Psychiatric  History: See previous Social History:  Social History   Substance and Sexual Activity  Alcohol Use Not Currently  . Frequency: Never     Social History   Substance and Sexual Activity  Drug Use Not on file    Social History   Socioeconomic History  . Marital status: Married    Spouse name: Not on file  . Number of children: Not on file  . Years of education: Not on file  . Highest education level: Not on file  Occupational History  . Not on file  Social Needs  . Financial resource strain: Not on file  . Food insecurity:    Worry: Not on file    Inability: Not on file  . Transportation needs:    Medical: Not on file    Non-medical: Not on file  Tobacco Use  . Smoking status: Never Smoker  . Smokeless tobacco: Never Used  Substance and Sexual Activity  . Alcohol use: Not Currently    Frequency: Never  . Drug use: Not on file  . Sexual activity: Not on file  Lifestyle  . Physical activity:    Days per week: Not on file    Minutes per session: Not on file  . Stress: Not on file  Relationships  . Social connections:    Talks on phone: Not on  file    Gets together: Not on file    Attends religious service: Not on file    Active member of club or organization: Not on file    Attends meetings of clubs or organizations: Not on file    Relationship status: Not on file  Other Topics Concern  . Not on file  Social History Narrative  . Not on file   Additional Social History:                         Sleep: Fair  Appetite:  Fair  Current Medications: Current Facility-Administered Medications  Medication Dose Route Frequency Provider Last Rate Last Dose  . acetaminophen (TYLENOL) tablet 650 mg  650 mg Oral Q6H PRN Catalina Gravel, NP   650 mg at 11/17/18 0819  . alum & mag hydroxide-simeth (MAALOX/MYLANTA) 200-200-20 MG/5ML suspension 30 mL  30 mL Oral Q4H PRN Thomspon, Adela Lank, NP      . feeding supplement (ENSURE ENLIVE) (ENSURE ENLIVE) liquid 237 mL  237 mL Oral BID BM ,  T, MD   237 mL at 11/20/18 1405  . hydrOXYzine (ATARAX/VISTARIL) tablet 25 mg  25 mg Oral Q6H PRN Catalina Gravel, NP   25 mg at 11/20/18 0223  . ibuprofen (ADVIL,MOTRIN) tablet 400 mg  400 mg Oral Q6H PRN Catalina Gravel, NP   400 mg at 11/18/18 0755  .  lisinopril (PRINIVIL,ZESTRIL) tablet 10 mg  10 mg Oral Daily , Jackquline Denmark, MD   10 mg at 11/20/18 0746  . loratadine (CLARITIN) tablet 10 mg  10 mg Oral Daily Catalina Gravel, NP   10 mg at 11/20/18 0746  . LORazepam (ATIVAN) tablet 1 mg  1 mg Oral Q4H PRN He, Jun, MD   1 mg at 11/20/18 0223  . magnesium hydroxide (MILK OF MAGNESIA) suspension 30 mL  30 mL Oral Daily PRN Catalina Gravel, NP      . multivitamin with minerals tablet 1 tablet  1 tablet Oral Daily , Jackquline Denmark, MD   1 tablet at 11/20/18 0746  . QUEtiapine (SEROQUEL) tablet 600 mg  600 mg Oral QHS ,  T, MD      . risperiDONE (RISPERDAL M-TABS) disintegrating tablet 1 mg  1 mg Oral Q6H PRN , Jackquline Denmark, MD   1 mg at 11/20/18 1313  . sertraline (ZOLOFT) tablet 50 mg  50 mg Oral QHS  He, Jun, MD      . traZODone (DESYREL) tablet 50 mg  50 mg Oral QHS Catalina Gravel, NP   50 mg at 11/18/18 2140    Lab Results: No results found for this or any previous visit (from the past 48 hour(s)).  Blood Alcohol level:  Lab Results  Component Value Date   ETH <10 11/09/2018    Metabolic Disorder Labs: No results found for: HGBA1C, MPG No results found for: PROLACTIN No results found for: CHOL, TRIG, HDL, CHOLHDL, VLDL, LDLCALC  Physical Findings: AIMS: Facial and Oral Movements Muscles of Facial Expression: None, normal Lips and Perioral Area: None, normal Jaw: None, normal Tongue: None, normal,Extremity Movements Upper (arms, wrists, hands, fingers): None, normal Lower (legs, knees, ankles, toes): None, normal, Trunk Movements Neck, shoulders, hips: None, normal, Overall Severity Severity of abnormal movements (highest score from questions above): None, normal Incapacitation due to abnormal movements: None, normal Patient's awareness of abnormal movements (rate only patient's report): No Awareness, Dental Status Current problems with teeth and/or dentures?: No Does patient usually wear dentures?: No  CIWA:    COWS:  COWS Total Score: 6  Musculoskeletal: Strength & Muscle Tone: within normal limits Gait & Station: normal Patient leans: N/A  Psychiatric Specialty Exam: Physical Exam  Nursing note and vitals reviewed. Constitutional: She appears well-developed and well-nourished.  HENT:  Head: Normocephalic and atraumatic.  Eyes: Pupils are equal, round, and reactive to light. Conjunctivae are normal.  Neck: Normal range of motion.  Cardiovascular: Regular rhythm and normal heart sounds.  Respiratory: Effort normal. No respiratory distress.  GI: Soft.  Musculoskeletal: Normal range of motion.  Neurological: She is alert.  Skin: Skin is warm and dry.  Psychiatric: Her affect is labile and inappropriate. Her speech is tangential. She is agitated. She is  not aggressive. Thought content is paranoid and delusional. Cognition and memory are impaired. She expresses impulsivity and inappropriate judgment.    Review of Systems  Constitutional: Negative.   HENT: Negative.   Eyes: Negative.   Respiratory: Negative.   Cardiovascular: Negative.   Gastrointestinal: Negative.   Musculoskeletal: Negative.   Skin: Negative.   Neurological: Negative.   Psychiatric/Behavioral: Positive for hallucinations and memory loss. The patient is nervous/anxious and has insomnia.     Blood pressure 126/85, pulse 97, temperature 98.2 F (36.8 C), temperature source Oral, resp. rate 16, height 5\' 1"  (1.549 m), weight 68.9 kg, last menstrual period 11/09/2018, SpO2 100 %.Body mass index is 28.72 kg/m.  General  Appearance: Disheveled  Eye Contact:  Fair  Speech:  Pressured  Volume:  Increased  Mood:  Irritable  Affect:  Inappropriate  Thought Process:  Disorganized  Orientation:  Full (Time, Place, and Person)  Thought Content:  Illogical, Paranoid Ideation, Rumination and Tangential  Suicidal Thoughts:  No  Homicidal Thoughts:  No  Memory:  Immediate;   Fair Recent;   Poor Remote;   Poor  Judgement:  Impaired  Insight:  Shallow  Psychomotor Activity:  Decreased  Concentration:  Concentration: Poor  Recall:  Poor  Fund of Knowledge:  Poor  Language:  Poor  Akathisia:  No  Handed:  Right  AIMS (if indicated):     Assets:  Desire for Improvement Physical Health Social Support  ADL's:  Impaired  Cognition:  Impaired,  Mild and Moderate  Sleep:  Number of Hours: 5.5     Treatment Plan Summary: Daily contact with patient to assess and evaluate symptoms and progress in treatment, Medication management and Plan Patient continues to be psychotic and agitated.  Worse than on Friday.  Not clear why things are getting worse.  Seroquel will be increased to 600 mg as she is still not sleeping despite current dosage.  Patient still has a one-to-one  intermittently because of her agitated behavior but appears to be safe on the ward for the moment.  Mordecai Rasmussen, MD 11/20/2018, 5:11 PM

## 2018-11-21 NOTE — Progress Notes (Signed)
Patient is maintained on 1&1 for safety of patient and others , patient is confused , paranoid and disorganized in thought process, patient will say the devil is hearing me and following me every where I go, patient will report can I go home now, , patient will be cyculing the hall way with wide eyes , appear delusional, sitter is  with the patient for safety, no distress.

## 2018-11-21 NOTE — Progress Notes (Signed)
RN provided update with husband. Husband expressed concerns of patient's stress level at home dealing with work and trying to self medicate. Husband expressed wanting neurological testing done. RN will relay message to MD.

## 2018-11-21 NOTE — [facility] Group Notes (Signed)
LCSW Group Therapy Note  11/21/2018 1:00 PM  Type of Therapy/Topic:  Group Therapy:  Feelings about Diagnosis  Participation Level:  Did Not Attend   Description of Group:   This group will allow patients to explore their thoughts and feelings about diagnoses they have received. Patients will be guided to explore their level of understanding and acceptance of these diagnoses. Facilitator will encourage patients to process their thoughts and feelings about the reactions of others to their diagnosis and will guide patients in identifying ways to discuss their diagnosis with significant others in their lives. This group will be process-oriented, with patients participating in exploration of their own experiences, giving and receiving support, and processing challenge from other group members.   Therapeutic Goals: 1. Patient will demonstrate understanding of diagnosis as evidenced by identifying two or more symptoms of the disorder 2. Patient will be able to express two feelings regarding the diagnosis 3. Patient will demonstrate their ability to communicate their needs through discussion and/or role play  Summary of Patient Progress: X  Therapeutic Modalities:   Cognitive Behavioral Therapy Brief Therapy Feelings Identification   [name], MSW, LCSW 11/21/2018 2:37 PM

## 2018-11-21 NOTE — Progress Notes (Signed)
Patient is taking her scheduled medicines and with out any side effect , patient is aware of her medication regimen and able to identify by there names, , patient continued to act in a bizarre manner to peers and intrusive, going to rooms that is not hers, but redirected, patient denies any suicidal homicidal ideations but still confused, 1&1 person in place for safety. No distress noted.

## 2018-11-21 NOTE — Progress Notes (Signed)
Recreation Therapy Notes  Date: 11/21/2018  Time: 9:30 am   Location: Craft room   Behavioral response: N/A   Intervention Topic: Communication  Discussion/Intervention: Patient did not attend group.   Clinical Observations/Feedback:  Patient did not attend group.   Jeorge Reister LRT/CTRS        Monica Zahler 11/21/2018 10:36 AM 

## 2018-11-21 NOTE — Progress Notes (Signed)
[facility] MD Progress Note  11/21/2018 4:07 PM Ashley Griffin  MRN:  [id number] Subjective: Follow-up for this young woman who continues to be psychotic on the ward.  Today on interview she is, if anything, even worse than yesterday.  Refused to come into a room alone with me at first but when she did she would not sit down.  Ranted at me in a very disorganized manner.  Clearly having ideas of reference and paranoia.  Seems to be hearing things at times.  It was impossible to talk with her and reason with her as everything I said just made her start yelling and become agitated again.  As far as we know she is taking her medicine as prescribed. Principal Problem: Brief reactive psychosis (HCC) Diagnosis: Principal Problem:   Brief reactive psychosis (HCC) Active Problems:   Depression   Anxiety state  Total Time spent with patient: 30 minutes  Past Psychiatric History: Patient has no significant past psychiatric history.  There is been speculation that she may have had depression after her last child was born but no previous hospitalization.  Past Medical History: History reviewed. No pertinent past medical history. History reviewed. No pertinent surgical history. Family History: History reviewed. No pertinent family history. Family Psychiatric  History: None reported Social History:  Social History   Substance and Sexual Activity  Alcohol Use Not Currently  . Frequency: Never     Social History   Substance and Sexual Activity  Drug Use Not on file    Social History   Socioeconomic History  . Marital status: Married    Spouse name: Not on file  . Number of children: Not on file  . Years of education: Not on file  . Highest education level: Not on file  Occupational History  . Not on file  Social Needs  . Financial resource strain: Not on file  . Food insecurity:    Worry: Not on file    Inability: Not on file  . Transportation needs:    Medical: Not on file    Non-medical: Not  on file  Tobacco Use  . Smoking status: Never Smoker  . Smokeless tobacco: Never Used  Substance and Sexual Activity  . Alcohol use: Not Currently    Frequency: Never  . Drug use: Not on file  . Sexual activity: Not on file  Lifestyle  . Physical activity:    Days per week: Not on file    Minutes per session: Not on file  . Stress: Not on file  Relationships  . Social connections:    Talks on phone: Not on file    Gets together: Not on file    Attends religious service: Not on file    Active member of club or organization: Not on file    Attends meetings of clubs or organizations: Not on file    Relationship status: Not on file  Other Topics Concern  . Not on file  Social History Narrative  . Not on file   Additional Social History:                         Sleep: Poor  Appetite:  Fair  Current Medications: Current Facility-Administered Medications  Medication Dose Route Frequency Provider Last Rate Last Dose  . acetaminophen (TYLENOL) tablet 650 mg  650 mg Oral Q6H PRN Catalina Gravel, NP   650 mg at 11/17/18 0819  . alum & mag hydroxide-simeth (MAALOX/MYLANTA) 200-200-20 MG/5ML  suspension 30 mL  30 mL Oral Q4H PRN Thomspon, Adela LankJacqueline, NP      . feeding supplement (ENSURE ENLIVE) (ENSURE ENLIVE) liquid 237 mL  237 mL Oral BID BM Avishai Reihl T, MD   237 mL at 11/21/18 1507  . hydrOXYzine (ATARAX/VISTARIL) tablet 25 mg  25 mg Oral Q6H PRN Catalina Gravelhomspon, Jacqueline, NP   25 mg at 11/20/18 0223  . ibuprofen (ADVIL,MOTRIN) tablet 400 mg  400 mg Oral Q6H PRN Catalina Gravelhomspon, Jacqueline, NP   400 mg at 11/18/18 0755  . lisinopril (PRINIVIL,ZESTRIL) tablet 10 mg  10 mg Oral Daily Terrace Chiem, Jackquline DenmarkJohn T, MD   10 mg at 11/21/18 0810  . loratadine (CLARITIN) tablet 10 mg  10 mg Oral Daily Catalina Gravelhomspon, Jacqueline, NP   10 mg at 11/21/18 0811  . LORazepam (ATIVAN) tablet 1 mg  1 mg Oral Q4H PRN He, Jun, MD   1 mg at 11/20/18 1808  . magnesium hydroxide (MILK OF MAGNESIA) suspension 30 mL   30 mL Oral Daily PRN Catalina Gravelhomspon, Jacqueline, NP      . multivitamin with minerals tablet 1 tablet  1 tablet Oral Daily Coe Angelos, Jackquline DenmarkJohn T, MD   1 tablet at 11/21/18 0810  . QUEtiapine (SEROQUEL) tablet 600 mg  600 mg Oral QHS Anias Bartol, Jackquline DenmarkJohn T, MD   600 mg at 11/20/18 2117  . risperiDONE (RISPERDAL M-TABS) disintegrating tablet 1 mg  1 mg Oral Q6H PRN Baylon Santelli, Jackquline DenmarkJohn T, MD   1 mg at 11/20/18 1313  . sertraline (ZOLOFT) tablet 50 mg  50 mg Oral QHS He, Jun, MD   50 mg at 11/20/18 2117  . traZODone (DESYREL) tablet 50 mg  50 mg Oral QHS Catalina Gravelhomspon, Jacqueline, NP   50 mg at 11/20/18 2117    Lab Results: No results found for this or any previous visit (from the past 48 hour(s)).  Blood Alcohol level:  Lab Results  Component Value Date   ETH <10 11/09/2018    Metabolic Disorder Labs: No results found for: HGBA1C, MPG No results found for: PROLACTIN No results found for: CHOL, TRIG, HDL, CHOLHDL, VLDL, LDLCALC  Physical Findings: AIMS: Facial and Oral Movements Muscles of Facial Expression: None, normal Lips and Perioral Area: None, normal Jaw: None, normal Tongue: None, normal,Extremity Movements Upper (arms, wrists, hands, fingers): None, normal Lower (legs, knees, ankles, toes): None, normal, Trunk Movements Neck, shoulders, hips: None, normal, Overall Severity Severity of abnormal movements (highest score from questions above): None, normal Incapacitation due to abnormal movements: None, normal Patient's awareness of abnormal movements (rate only patient's report): No Awareness, Dental Status Current problems with teeth and/or dentures?: No Does patient usually wear dentures?: No  CIWA:    COWS:  COWS Total Score: 6  Musculoskeletal: Strength & Muscle Tone: within normal limits Gait & Station: normal Patient leans: N/A  Psychiatric Specialty Exam: Physical Exam  Nursing note and vitals reviewed. Constitutional: She appears well-developed and well-nourished.  HENT:  Head:  Normocephalic and atraumatic.  Eyes: Pupils are equal, round, and reactive to light. Conjunctivae are normal.  Neck: Normal range of motion.  Cardiovascular: Regular rhythm and normal heart sounds.  Respiratory: Effort normal.  GI: Soft.  Musculoskeletal: Normal range of motion.  Neurological: She is alert.  Skin: Skin is warm and dry.  Psychiatric: Her affect is labile and inappropriate. Her speech is tangential. She is agitated. She is not aggressive. Thought content is paranoid and delusional. Cognition and memory are impaired. She expresses impulsivity.    Review of Systems  Constitutional: Negative.   HENT: Negative.   Eyes: Negative.   Respiratory: Negative.   Cardiovascular: Negative.   Gastrointestinal: Negative.   Musculoskeletal: Negative.   Skin: Negative.   Neurological: Negative.   Psychiatric/Behavioral: The patient is nervous/anxious and has insomnia.     Blood pressure (!) 142/80, pulse (!) 127, temperature 98.3 F (36.8 C), temperature source Oral, resp. rate 16, height 5\' 1"  (1.549 m), weight 68.9 kg, last menstrual period 11/09/2018, SpO2 99 %.Body mass index is 28.72 kg/m.  General Appearance: Disheveled  Eye Contact:  Poor  Speech:  Garbled and Pressured  Volume:  Increased  Mood:  Angry and Irritable  Affect:  Inappropriate and Labile  Thought Process:  Disorganized  Orientation:  Negative  Thought Content:  Illogical  Suicidal Thoughts:  No  Homicidal Thoughts:  No  Memory:  Negative  Judgement:  Negative  Insight:  Negative  Psychomotor Activity:  Restlessness  Concentration:  Concentration: Poor  Recall:  Poor  Fund of Knowledge:  Poor  Language:  Poor  Akathisia:  Negative  Handed:  Right  AIMS (if indicated):     Assets:  Housing Physical Health Resilience Social Support  ADL's:  Impaired  Cognition:  Impaired,  Moderate  Sleep:  Number of Hours: 7     Treatment Plan Summary: Daily contact with patient to assess and evaluate  symptoms and progress in treatment, Medication management and Plan Patient, I am sorry to say, remains very psychotic.  If anything even worse.  Paranoid disorganized agitated.  Not able to even begin to discuss her treatment much less a discharge plan.  I also know that her husband has called today and is upset that I did not reach out to him yesterday.  I explained to staff that I spoke to the patient's mother instead but I need to try and talk with the husband today.  Patient certainly is still psychotic despite a current dose of Seroquel that has been moved up to 600 mg.  Nurses tell me she is taking her medicine.  If were not seeing any improvement in behavior or sleep tomorrow we may have to change medicine to something that we can be more absolutely certain of her taking or just to a different antipsychotic.  I will try to check in with her husband today.  Mordecai Rasmussen, MD 11/21/2018, 4:07 PM

## 2018-11-21 NOTE — Progress Notes (Signed)
Patient continues to be confused, standing in the hallway stating," The building is going to fall I need to get out of here, I am no longer in the system."  Patient experiencing anxiety, very tearful. Patient received  Anxiety medication given to help with mood.

## 2018-11-21 NOTE — Plan of Care (Signed)
Patient continues to be very confused, fearful, having flight of ideas with religious preoccupation. Patient is a 1:1 sitter case for safety. Patient ate breakfast this morning and took medications appropriately. Patient is now sleeping in room MHT within arms reach. Problem: Education: Goal: Utilization of techniques to improve thought processes will improve Outcome: Not Progressing Goal: Knowledge of the prescribed therapeutic regimen will improve Outcome: Not Progressing   Problem: Coping: Goal: Coping ability will improve Outcome: Not Progressing Goal: Will verbalize feelings Outcome: Not Progressing   Problem: Health Behavior/Discharge Planning: Goal: Identification of resources available to assist in meeting health care needs will improve Outcome: Not Progressing   Problem: Self-Concept: Goal: Will verbalize positive feelings about self Outcome: Not Progressing

## 2018-11-21 NOTE — [facility] Counselor (Signed)
CSW returned to the pt's husband.  Husband reports that he would like for pt's code to be changed and/or nursing staff and Dr. Toni Amend to no longer speak with mother regarding care.  CSW informed that she will mention this in progression.     [name], MSW, LCSW 11/21/2018 8:24 AM
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
## 2018-11-21 NOTE — Progress Notes (Signed)
Patient is in bed but not sleeping eyes is open and having conversations with  her sitter, patient is safe and no clutters in the room, monitoring is continued no distress.

## 2018-11-21 NOTE — Progress Notes (Signed)
Patient is not improving in her state of confusion, she got worse after speaking to her husband on the phone, she starts cycling the nurses station and saying she wants to go home to see her husband and children, patient will cast spells on the devil and calling satan behind me., preoccupied with religion , patient is compliant with her medication, safety is maintained , room is free of  clutters , education is provided , patient has support and 1&1 with a sitter , patient is kept safe no distress.
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
## 2018-11-21 NOTE — Progress Notes (Signed)
Patient is well rested during the night with out any further issues, sleep is continuous and remain on 1&1 for safety , no distress.

## 2018-11-22 MED ORDER — OLANZAPINE 5 MG PO TBDP
15.0000 mg | ORAL_TABLET | Freq: Every day | ORAL | Status: DC
  Administered 2018-11-22 – 2018-11-23 (×2): 15 mg via ORAL
  Filled 2018-11-22 (×2): qty 3

## 2018-11-22 NOTE — Progress Notes (Signed)
Patient did eat her dinner in the dinning area and paced the hallways a few times. Patient then began to yell at staff member to stop following her. Patient re-oriented about rules when on a 1:1 sitter case. Patient received Ativan at 1829 to help with anxiety. Patient then went to her room every tearful. Patient believes peers are controlling her and watching her every move. Patient continues to be on a 1:1 for erratic behaviors.
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
## 2018-11-22 NOTE — Plan of Care (Signed)
Patient is alert to self, disorganized to situation and time. Patient states she slept well last night. Patient states, "I feel better." Patient still having bizarre behaviors, and delusions, preoccupied with religious thoughts and paranoia. Patient is currently on a 1:1 sitter case for safety. Patient denies Pain at this moment. Patient nodded her head "yes" when asked if she feels safe at the hospital. 1:1 will continue. Problem: Education: Goal: Utilization of techniques to improve thought processes will improve Outcome: Not Progressing Goal: Knowledge of the prescribed therapeutic regimen will improve Outcome: Not Progressing   Problem: Coping: Goal: Coping ability will improve Outcome: Not Progressing Goal: Will verbalize feelings Outcome: Not Progressing   Problem: Health Behavior/Discharge Planning: Goal: Identification of resources available to assist in meeting health care needs will improve Outcome: Not Progressing   Problem: Self-Concept: Goal: Will verbalize positive feelings about self Outcome: Not Progressing

## 2018-11-22 NOTE — Progress Notes (Signed)
Patient's husband called asked to speak to wife did not ask RN question on first phone call. Telephone call transferred to patient.

## 2018-11-22 NOTE — Progress Notes (Signed)
Recreation Therapy Notes          Faustina Gebert 11/22/2018 1:21 PM

## 2018-11-22 NOTE — Progress Notes (Signed)
[facility] MD Progress Note  11/22/2018 4:34 PM Ashley Griffin  MRN:  [id number] Subjective: Follow-up for this woman with psychotic disorder.  Patient seen chart reviewed.  During interview today the patient was very hesitant to come into the room with me.  Would only come in if 2 of the techs stood outside the door.  While in the room with me the patient stood up and accused me of having killed her several times and also accused me of having gotten rid of her and created a new person with the same name.  I tried to get her to think this through and see how it made no sense but she was very disorganized and paranoid.  She is having a hard time cooperating in any sort of lucid way on the unit.  Looks anxious and confused all the time.  Slept some last night but still complains of feeling like she is not sleeping well. Principal Problem: Brief reactive psychosis (HCC) Diagnosis: Principal Problem:   Brief reactive psychosis (HCC) Active Problems:   Depression   Anxiety state  Total Time spent with patient: 30 minutes  Past Psychiatric History: Patient has no significant past psychiatric history  Past Medical History: History reviewed. No pertinent past medical history. History reviewed. No pertinent surgical history. Family History: History reviewed. No pertinent family history. Family Psychiatric  History: None noted Social History:  Social History   Substance and Sexual Activity  Alcohol Use Not Currently  . Frequency: Never     Social History   Substance and Sexual Activity  Drug Use Not on file    Social History   Socioeconomic History  . Marital status: Married    Spouse name: Not on file  . Number of children: Not on file  . Years of education: Not on file  . Highest education level: Not on file  Occupational History  . Not on file  Social Needs  . Financial resource strain: Not on file  . Food insecurity:    Worry: Not on file    Inability: Not on file  . Transportation  needs:    Medical: Not on file    Non-medical: Not on file  Tobacco Use  . Smoking status: Never Smoker  . Smokeless tobacco: Never Used  Substance and Sexual Activity  . Alcohol use: Not Currently    Frequency: Never  . Drug use: Not on file  . Sexual activity: Not on file  Lifestyle  . Physical activity:    Days per week: Not on file    Minutes per session: Not on file  . Stress: Not on file  Relationships  . Social connections:    Talks on phone: Not on file    Gets together: Not on file    Attends religious service: Not on file    Active member of club or organization: Not on file    Attends meetings of clubs or organizations: Not on file    Relationship status: Not on file  Other Topics Concern  . Not on file  Social History Narrative  . Not on file   Additional Social History:                         Sleep: Fair  Appetite:  Fair  Current Medications: Current Facility-Administered Medications  Medication Dose Route Frequency Provider Last Rate Last Dose  . acetaminophen (TYLENOL) tablet 650 mg  650 mg Oral Q6H PRN Catalina Gravel, NP  650 mg at 11/17/18 0819  . alum & mag hydroxide-simeth (MAALOX/MYLANTA) 200-200-20 MG/5ML suspension 30 mL  30 mL Oral Q4H PRN Thomspon, Adela Lank, NP      . feeding supplement (ENSURE ENLIVE) (ENSURE ENLIVE) liquid 237 mL  237 mL Oral BID BM Jaquavian Firkus T, MD   237 mL at 11/22/18 1004  . hydrOXYzine (ATARAX/VISTARIL) tablet 25 mg  25 mg Oral Q6H PRN Catalina Gravel, NP   25 mg at 11/20/18 0223  . ibuprofen (ADVIL,MOTRIN) tablet 400 mg  400 mg Oral Q6H PRN Catalina Gravel, NP   400 mg at 11/18/18 0755  . lisinopril (PRINIVIL,ZESTRIL) tablet 10 mg  10 mg Oral Daily Denesia Donelan, Jackquline Denmark, MD   10 mg at 11/22/18 0810  . loratadine (CLARITIN) tablet 10 mg  10 mg Oral Daily Catalina Gravel, NP   10 mg at 11/22/18 0810  . LORazepam (ATIVAN) tablet 1 mg  1 mg Oral Q4H PRN He, Jun, MD   1 mg at 11/22/18 1025  .  magnesium hydroxide (MILK OF MAGNESIA) suspension 30 mL  30 mL Oral Daily PRN Catalina Gravel, NP      . multivitamin with minerals tablet 1 tablet  1 tablet Oral Daily Camila Maita, Jackquline Denmark, MD   1 tablet at 11/22/18 0810  . OLANZapine zydis (ZYPREXA) disintegrating tablet 15 mg  15 mg Oral QHS Montie Gelardi T, MD      . risperiDONE (RISPERDAL M-TABS) disintegrating tablet 1 mg  1 mg Oral Q6H PRN Daved Mcfann, Jackquline Denmark, MD   1 mg at 11/20/18 1313  . sertraline (ZOLOFT) tablet 50 mg  50 mg Oral QHS He, Jun, MD   50 mg at 11/21/18 2125  . traZODone (DESYREL) tablet 50 mg  50 mg Oral QHS Catalina Gravel, NP   50 mg at 11/21/18 2125    Lab Results: No results found for this or any previous visit (from the past 48 hour(s)).  Blood Alcohol level:  Lab Results  Component Value Date   ETH <10 11/09/2018    Metabolic Disorder Labs: No results found for: HGBA1C, MPG No results found for: PROLACTIN No results found for: CHOL, TRIG, HDL, CHOLHDL, VLDL, LDLCALC  Physical Findings: AIMS: Facial and Oral Movements Muscles of Facial Expression: None, normal Lips and Perioral Area: None, normal Jaw: None, normal Tongue: None, normal,Extremity Movements Upper (arms, wrists, hands, fingers): None, normal Lower (legs, knees, ankles, toes): None, normal, Trunk Movements Neck, shoulders, hips: None, normal, Overall Severity Severity of abnormal movements (highest score from questions above): None, normal Incapacitation due to abnormal movements: None, normal Patient's awareness of abnormal movements (rate only patient's report): No Awareness, Dental Status Current problems with teeth and/or dentures?: No Does patient usually wear dentures?: No  CIWA:    COWS:  COWS Total Score: 6  Musculoskeletal: Strength & Muscle Tone: within normal limits Gait & Station: normal Patient leans: N/A  Psychiatric Specialty Exam: Physical Exam  Nursing note and vitals reviewed. Constitutional: She appears  well-developed and well-nourished.  HENT:  Head: Normocephalic and atraumatic.  Eyes: Pupils are equal, round, and reactive to light. Conjunctivae are normal.  Neck: Normal range of motion.  Cardiovascular: Regular rhythm and normal heart sounds.  Respiratory: Effort normal. No respiratory distress.  GI: Soft.  Musculoskeletal: Normal range of motion.  Neurological: She is alert.  Skin: Skin is warm and dry.  Psychiatric: Her mood appears anxious. Her affect is labile. Her speech is rapid and/or pressured. She is agitated. She is not aggressive. Thought  content is paranoid and delusional. Cognition and memory are impaired. She expresses impulsivity and inappropriate judgment.    Review of Systems  Constitutional: Negative.   HENT: Negative.   Eyes: Negative.   Respiratory: Negative.   Cardiovascular: Negative.   Gastrointestinal: Negative.   Musculoskeletal: Negative.   Skin: Negative.   Neurological: Negative.   Psychiatric/Behavioral: Positive for hallucinations and memory loss. The patient is nervous/anxious and has insomnia.     Blood pressure 120/78, pulse (!) 102, temperature 98.2 F (36.8 C), temperature source Oral, resp. rate 18, height  (1.549 m), weight 68.9 kg, last menstrual period 11/09/2018, SpO2 100 %.Body mass index is 28.72 kg/m.  General Appearance: Disheveled  Eye Contact:  Good  Speech:  Pressured  Volume:  Increased  Mood:  Anxious and Irritable  Affect:  Inappropriate and Labile  Thought Process:  Disorganized  Orientation:  Negative  Thought Content:  Illogical, Delusions, Paranoid Ideation and Rumination  Suicidal Thoughts:  No  Homicidal Thoughts:  No  Memory:  Immediate;   Fair Recent;   Poor Remote;   Fair  Judgement:  Impaired  Insight:  Lacking  Psychomotor Activity:  Restlessness  Concentration:  Concentration: Poor  Recall:  Poor  Fund of Knowledge:  Fair  Language:  Fair  Akathisia:  No  Handed:  Right  AIMS (if indicated):      Assets:  Desire for Improvement Housing Physical Health Resilience Social Support  ADL's:  Impaired  Cognition:  Impaired,  Mild  Sleep:  Number of Hours: 6.5     Treatment Plan Summary: Daily contact with patient to assess and evaluate symptoms and progress in treatment, Medication management and Plan Patient remains very psychotic.  She told me that she definitely was taking her Seroquel every night.  We do not have any reason to think that she is necessarily been cheeking it.  Despite this she is showing minimal to no improvement now on 600 mg of Seroquel.  Based on this I am going to change her antipsychotic.  Discontinue Seroquel as of tonight start olanzapine at 15 mg at night.  Explained the difference and the side effects to the patient.  Tried to form some rapport with her and do some reality testing.  Continue current treatment plan.  Continue trying to keep her husband informed.  Mordecai Rasmussen, MD 11/22/2018, 4:34 PM

## 2018-11-22 NOTE — [facility] Group Notes (Signed)
[facility] Group Notes:  (Nursing/MHT/Case Management/Adjunct)  Date:  11/22/2018  Time:  11:55 PM  Type of Therapy:  Group Therapy  Participation Level:  Active  Participation Quality:  Appropriate  Affect:  Appropriate  Cognitive:  Appropriate  Insight:  Appropriate  Engagement in Group:  Engaged  Modes of Intervention:  Support  Summary of Progress/Problems:  Ashley Griffin 11/22/2018, 11:55 PM

## 2018-11-22 NOTE — [facility] Group Notes (Signed)
Emotional Regulation 11/22/2018 1PM  Type of Therapy/Topic:  Group Therapy:  Emotion Regulation  Participation Level:  Active   Description of Group:   The purpose of this group is to assist patients in learning to regulate negative emotions and experience positive emotions. Patients will be guided to discuss ways in which they have been vulnerable to their negative emotions. These vulnerabilities will be juxtaposed with experiences of positive emotions or situations, and patients will be challenged to use positive emotions to combat negative ones. Special emphasis will be placed on coping with negative emotions in conflict situations, and patients will process healthy conflict resolution skills.  Therapeutic Goals: 1. Patient will identify two positive emotions or experiences to reflect on in order to balance out negative emotions 2. Patient will label two or more emotions that they find the most difficult to experience 3. Patient will demonstrate positive conflict resolution skills through discussion and/or role plays  Summary of Patient Progress: Pt disorganized and stated to one group member "watch what you say there are children in the room." However she was able to identify spending time with family and friends as her coping method.      Therapeutic Modalities:   Cognitive Behavioral Therapy Feelings Identification Dialectical Behavioral Therapy   Suzan Slick, LCSW 11/22/2018 2:07 PM

## 2018-11-23 NOTE — [facility] Group Notes (Signed)
LCSW Group Therapy Note  11/23/2018 11:29 AM  Type of Therapy/Topic:  Group Therapy:  Balance in Life  Participation Level:  Did Not Attend  Description of Group:    This group will address the concept of balance and how it feels and looks when one is unbalanced. Patients will be encouraged to process areas in their lives that are out of balance and identify reasons for remaining unbalanced. Facilitators will guide patients in utilizing problem-solving interventions to address and correct the stressor making their life unbalanced. Understanding and applying boundaries will be explored and addressed for obtaining and maintaining a balanced life. Patients will be encouraged to explore ways to assertively make their unbalanced needs known to significant others in their lives, using other group members and facilitator for support and feedback.  Therapeutic Goals: 1. Patient will identify two or more emotions or situations they have that consume much of in their lives. 2. Patient will identify signs/triggers that life has become out of balance:  3. Patient will identify two ways to set boundaries in order to achieve balance in their lives:  4. Patient will demonstrate ability to communicate their needs through discussion and/or role plays  Summary of Patient Progress:  x    Therapeutic Modalities:   Cognitive Behavioral Therapy Solution-Focused Therapy Assertiveness Training  Renner Sebald, MSW, LCSW Clinical Social Work 11/23/2018 11:29 AM   

## 2018-11-23 NOTE — [facility] Counselor (Signed)
CSW spoke with the patient's husband who called to ask what items he could take on the unit.  CSW called to unit and got better understanding of items that the pt can have on the unit.  CSW reported that the husband could bring books, and pictures without the frames to the unit.  Husband was asking about them.    [name], MSW, LCSW 11/23/2018 3:17 PM

## 2018-11-23 NOTE — Plan of Care (Signed)
  Problem: Coping: Goal: Will verbalize feelings Outcome: Progressing  Patient verbalized feelings to staff.  

## 2018-11-23 NOTE — Progress Notes (Signed)
Recreation Therapy Notes   Date: 11/23/2018  Time: 9:30 am  Location: Craft Room  Behavioral response: Appropriate  Intervention Topic: Self-Care  Discussion/Intervention:  Group content today was focused on Self-Care. The group defined self-care and some positive ways they care for themselves. Individuals expressed ways and reasons why they neglected any self-care in the past. Patients described ways to improve self-care in the future. The group explained what could happen if they did not do any self-care activities at all. The group participated in the intervention "self-care assessment" where they had a chance to discover some of their weaknesses and strengths in self- care. Patient came up with a self-care plan to improve themselves in the future.  Clinical Observations/Feedback:  Patient came to group and defined self-care as prioritizing life according to boundaries. She explained that she could improve her self-care skills by being true to herself and having a healthy life style. Participant expressed that her self care is basic for her and her children. Patient stated you must fill your cup before you can fill others.  Individual participated in the intervention and was social with peers and staff. Ashley Griffin LRT/CTRS          Ashley Griffin 11/23/2018 11:03 AM

## 2018-11-23 NOTE — Progress Notes (Signed)
Patient is alert and oriented x 2 with periods of confusion to place and situation. Patient is on 1:1 for safety, her thoughts are disorganized and incoherent, speech is tangential with flights of ideas, she was noted tearful this evening and delusional at times accusing the nursing staff that they don't feed the patients and none of the patients had evening meal. Patient was frequently reoriented to reality and redirected from going into other patient;'s room. Patient was given emotional support and encouraged to attend evening wrap up group, she attended group and she was appropriate. No distress noted, 15 minutes safety checks maintained, will continue to monitor.

## 2018-11-23 NOTE — Progress Notes (Signed)
On 1:1 no distress noted, conditio unchanged, sitter at bedside, will continue to monitor

## 2018-11-23 NOTE — Plan of Care (Signed)
Patient is in bed states "my brain need a rest.I am trying to take a nap.My family is safe so I feel safe here."Affect is brighter.Denies SI,HI and AVH.Compliant with medications.Patient was outside today.Appetite and energy level good.Support and encouragement given.

## 2018-11-23 NOTE — Progress Notes (Signed)
Patient is off 1:1 now.Patient stated that she feels safe in the hospital.Patient stated that she slept good last night.Compliant with medications.Support and encouragement given.

## 2018-11-23 NOTE — Progress Notes (Signed)
[facility]  11/23/2018 12:36 PM Ashley Griffin  MRN:  [id number] Subjective: Patient seen.  Chart reviewed.  Patient and I spoke for quite a while today.  The conversation was significantly different from the one we had yesterday.  Patient appeared calm and was able to sit down in a room with me without keeping the door open or insisting that someone stood outside.  When I asked her what was on her mind she replied with vague general statements about missing her children and her family.  I asked her about the specific delusion she had shown yesterday and she acknowledged having them.  She did not repeat them but would not deny that they were still going on and she simply change the subject.  Ultimately came down to what she really wanted to ask was whether she could be discharged today.  Patient is still displaying paranoia.  Still tells me that at night people are "violating" her.  When I asked her to explain that she gives me a slight paranoid look and tells me I know exactly what she means.  Patient is still disheveled and appears disorganized much of the time on the unit.  Her medicine was changed last night from Seroquel to Zyprexa. Principal Problem: Brief reactive psychosis (HCC) Diagnosis: Principal Problem:   Brief reactive psychosis (HCC) Active Problems:   Depression   Anxiety state  Total Time spent with patient: 30 minutes  Past Psychiatric History: Patient has no significant past psychiatric history before this psychotic episode  Past Medical History: History reviewed. No pertinent past medical history. History reviewed. No pertinent surgical history. Family History: History reviewed. No pertinent family history. Family Psychiatric  History: None known Social History:  Social History   Substance and Sexual Activity  Alcohol Use Not Currently  . Frequency: Never     Social History   Substance and Sexual Activity  Drug Use Not on file    Social History    Socioeconomic History  . Marital status: Married    Spouse name: Not on file  . Number of children: Not on file  . Years of education: Not on file  . Highest education level: Not on file  Occupational History  . Not on file  Social Needs  . Financial resource strain: Not on file  . Food insecurity:    Worry: Not on file    Inability: Not on file  . Transportation needs:    Medical: Not on file    Non-medical: Not on file  Tobacco Use  . Smoking status: Never Smoker  . Smokeless tobacco: Never Used  Substance and Sexual Activity  . Alcohol use: Not Currently    Frequency: Never  . Drug use: Not on file  . Sexual activity: Not on file  Lifestyle  . Physical activity:    Days per week: Not on file    Minutes per session: Not on file  . Stress: Not on file  Relationships  . Social connections:    Talks on phone: Not on file    Gets together: Not on file    Attends religious service: Not on file    Active member of club or organization: Not on file    Attends meetings of clubs or organizations: Not on file    Relationship status: Not on file  Other Topics Concern  . Not on file  Social History Narrative  . Not on file   Additional Social History:  Sleep: Fair  Appetite:  Fair  Current Medications: Current Facility-Administered Medications  Medication Dose Route Frequency Provider Last Rate Last Dose  . acetaminophen (TYLENOL) tablet 650 mg  650 mg Oral Q6H PRN Catalina Gravel, NP   650 mg at 11/17/18 0819  . alum & mag hydroxide-simeth (MAALOX/MYLANTA) 200-200-20 MG/5ML suspension 30 mL  30 mL Oral Q4H PRN Thomspon, Adela Lank, NP      . feeding supplement (ENSURE ENLIVE) (ENSURE ENLIVE) liquid 237 mL  237 mL Oral BID BM Danuel Felicetti T, MD   237 mL at 11/23/18 1022  . hydrOXYzine (ATARAX/VISTARIL) tablet 25 mg  25 mg Oral Q6H PRN Catalina Gravel, NP   25 mg at 11/20/18 0223  . ibuprofen (ADVIL,MOTRIN) tablet 400 mg   400 mg Oral Q6H PRN Catalina Gravel, NP   400 mg at 11/18/18 0755  . lisinopril (PRINIVIL,ZESTRIL) tablet 10 mg  10 mg Oral Daily Alexandrya Chim, Jackquline Denmark, MD   10 mg at 11/23/18 0758  . loratadine (CLARITIN) tablet 10 mg  10 mg Oral Daily Catalina Gravel, NP   10 mg at 11/23/18 0759  . LORazepam (ATIVAN) tablet 1 mg  1 mg Oral Q4H PRN He, Jun, MD   1 mg at 11/22/18 1829  . magnesium hydroxide (MILK OF MAGNESIA) suspension 30 mL  30 mL Oral Daily PRN Catalina Gravel, NP      . multivitamin with minerals tablet 1 tablet  1 tablet Oral Daily Jermika Olden, Jackquline Denmark, MD   1 tablet at 11/23/18 0758  . OLANZapine zydis (ZYPREXA) disintegrating tablet 15 mg  15 mg Oral QHS Tabetha Haraway, Jackquline Denmark, MD   15 mg at 11/22/18 2126  . risperiDONE (RISPERDAL M-TABS) disintegrating tablet 1 mg  1 mg Oral Q6H PRN Ellenor Wisniewski, Jackquline Denmark, MD   1 mg at 11/20/18 1313  . sertraline (ZOLOFT) tablet 50 mg  50 mg Oral QHS He, Jun, MD   50 mg at 11/22/18 2127  . traZODone (DESYREL) tablet 50 mg  50 mg Oral QHS Catalina Gravel, NP   50 mg at 11/22/18 2100    Lab Results: No results found for this or any previous visit (from the past 48 hour(s)).  Blood Alcohol level:  Lab Results  Component Value Date   ETH <10 11/09/2018    Metabolic Disorder Labs: No results found for: HGBA1C, MPG No results found for: PROLACTIN No results found for: CHOL, TRIG, HDL, CHOLHDL, VLDL, LDLCALC  Physical Findings: AIMS: Facial and Oral Movements Muscles of Facial Expression: None, normal Lips and Perioral Area: None, normal Jaw: None, normal Tongue: None, normal,Extremity Movements Upper (arms, wrists, hands, fingers): None, normal Lower (legs, knees, ankles, toes): None, normal, Trunk Movements Neck, shoulders, hips: None, normal, Overall Severity Severity of abnormal movements (highest score from questions above): None, normal Incapacitation due to abnormal movements: None, normal Patient's awareness of abnormal movements (rate only  patient's report): No Awareness, Dental Status Current problems with teeth and/or dentures?: No Does patient usually wear dentures?: No  CIWA:    COWS:  COWS Total Score: 6  Musculoskeletal: Strength & Muscle Tone: within normal limits Gait & Station: normal Patient leans: N/A  Psychiatric Specialty Exam: Physical Exam  Nursing note and vitals reviewed. Constitutional: She appears well-developed and well-nourished.  HENT:  Head: Normocephalic and atraumatic.  Eyes: Pupils are equal, round, and reactive to light. Conjunctivae are normal.  Neck: Normal range of motion.  Cardiovascular: Regular rhythm and normal heart sounds.  Respiratory: Effort normal.  GI: Soft.  Musculoskeletal:  Normal range of motion.  Neurological: She is alert.  Skin: Skin is warm and dry.  Psychiatric: Her affect is blunt. Her speech is tangential. She is slowed. Thought content is paranoid. Cognition and memory are impaired. She expresses impulsivity. She expresses no homicidal and no suicidal ideation.    Review of Systems  Constitutional: Negative.   HENT: Negative.   Eyes: Negative.   Respiratory: Negative.   Cardiovascular: Negative.   Gastrointestinal: Negative.   Musculoskeletal: Negative.   Skin: Negative.   Neurological: Negative.   Psychiatric/Behavioral: Positive for depression. Negative for hallucinations, memory loss and suicidal ideas. The patient is nervous/anxious and has insomnia.     Blood pressure 124/83, pulse (!) 108, temperature 98.5 F (36.9 C), temperature source Oral, resp. rate 17, height  (1.549 m), weight 68.9 kg, last menstrual period 11/09/2018, SpO2 98 %.Body mass index is 28.72 kg/m.  General Appearance: Casual and Disheveled  Eye Contact:  Fair  Speech:  Slow  Volume:  Decreased  Mood:  Euthymic  Affect:  Constricted  Thought Process:  Coherent  Orientation:  Full (Time, Place, and Person)  Thought Content:  Illogical, Delusions and Paranoid Ideation   Suicidal Thoughts:  No  Homicidal Thoughts:  No  Memory:  Immediate;   Fair Recent;   Fair Remote;   Fair  Judgement:  Impaired  Insight:  Shallow  Psychomotor Activity:  Normal  Concentration:  Concentration: Fair  Recall:  [employer] of Knowledge:  Fair  Language:  Fair  Akathisia:  No  Handed:  Right  AIMS (if indicated):     Assets:  Communication Skills Desire for Improvement Physical Health Resilience  ADL's:  Impaired  Cognition:  Impaired,  Mild  Sleep:  Number of Hours: 7     Treatment Plan Summary: Daily contact with patient to assess and evaluate symptoms and progress in treatment, Medication management and Plan For the first half of our conversation things were going well.  She clearly seemed to be less agitated and paranoid and less prone to having her delusions right at the surface than she was yesterday.  I congratulated her and agreed that she seemed to be doing much better today.  However once she got onto the topic of discharge she became more agitated.  I explained to her several times the rationale for not discharging a person the first day that we see some improvement.  I illustrated it with examples from her own hospital stay how she has briefly improved for a day or part of the day and then rapidly become psychotic again.  Despite this the patient refused to acknowledge that there was any reason for her to still be in the hospital.  We also talked about her medication and she admitted that she had no intention of continuing antipsychotics outside the hospital.  I told her I thought that would be a very bad idea as I thought she was likely to rapidly relapse.  This did not make any impression on her.  Unfortunately she is still psychotic disorganized and paranoid.  High risk of relapse and dangerousness outside the hospital.  However she does seem to be tolerating and perhaps benefiting from the Zyprexa or at least taking it.  No further change to medicine today.   Encourage patient in attendance at groups.  After discussion with the patient and nursing I am discontinuing the one-to-one sitter as the patient is complaining more about being watched too much than she is about feeling like  she needs someone with her all the time.  Mordecai Rasmussen, MD 11/23/2018, 12:36 PM

## 2018-11-24 ENCOUNTER — Inpatient Hospital Stay: Payer: 59

## 2018-11-24 MED ORDER — OLANZAPINE 5 MG PO TBDP
20.0000 mg | ORAL_TABLET | Freq: Every day | ORAL | Status: DC
  Administered 2018-11-24 – 2018-11-29 (×6): 20 mg via ORAL
  Filled 2018-11-24 (×7): qty 4

## 2018-11-24 MED ORDER — SERTRALINE HCL 100 MG PO TABS
100.0000 mg | ORAL_TABLET | Freq: Every day | ORAL | Status: DC
  Administered 2018-11-24 – 2018-11-29 (×6): 100 mg via ORAL
  Filled 2018-11-24 (×5): qty 1

## 2018-11-24 MED ORDER — LISINOPRIL 20 MG PO TABS
20.0000 mg | ORAL_TABLET | Freq: Every day | ORAL | Status: DC
  Administered 2018-11-25 – 2018-11-30 (×6): 20 mg via ORAL
  Filled 2018-11-24 (×6): qty 1

## 2018-11-24 NOTE — Progress Notes (Signed)
Spoke with patient regarding  ECT. She asked this nurse to totally describe the process and I did. After the conversation she stated,"I can not trust anyone." This nurse therapeutically listened to the patient as she voiced her concern of wanting to be discharged. The booklet describing ECT. As I am in the nursing station the patient is staring at me with the booklet in hand.  Dr. Toni Amend contacted regarding the meeting with the patient.

## 2018-11-24 NOTE — Tx Team (Signed)
Interdisciplinary Treatment and Diagnostic Plan Update  11/24/2018 Time of Session: 8:30AM  Ashley Griffin MRN: [id number]  Principal Diagnosis: Brief reactive psychosis [facility])  Secondary Diagnoses: Principal Problem:   Brief reactive psychosis (HCC) Active Problems:   Depression   Anxiety state   Current Medications:  Current Facility-Administered Medications  Medication Dose Route Frequency Provider Last Rate Last Dose  . acetaminophen (TYLENOL) tablet 650 mg  650 mg Oral Q6H PRN Catalina Gravel, NP   650 mg at 11/17/18 0819  . alum & mag hydroxide-simeth (MAALOX/MYLANTA) 200-200-20 MG/5ML suspension 30 mL  30 mL Oral Q4H PRN Thomspon, Adela Lank, NP      . feeding supplement (ENSURE ENLIVE) (ENSURE ENLIVE) liquid 237 mL  237 mL Oral BID BM Clapacs, John T, MD   237 mL at 11/24/18 1000  . hydrOXYzine (ATARAX/VISTARIL) tablet 25 mg  25 mg Oral Q6H PRN Catalina Gravel, NP   25 mg at 11/20/18 0223  . ibuprofen (ADVIL,MOTRIN) tablet 400 mg  400 mg Oral Q6H PRN Catalina Gravel, NP   400 mg at 11/18/18 0755  . [START ON 11/25/2018] lisinopril (PRINIVIL,ZESTRIL) tablet 20 mg  20 mg Oral Daily Clapacs, John T, MD      . loratadine (CLARITIN) tablet 10 mg  10 mg Oral Daily Catalina Gravel, NP   10 mg at 11/24/18 0804  . LORazepam (ATIVAN) tablet 1 mg  1 mg Oral Q4H PRN He, Jun, MD   1 mg at 11/22/18 1829  . magnesium hydroxide (MILK OF MAGNESIA) suspension 30 mL  30 mL Oral Daily PRN Catalina Gravel, NP      . multivitamin with minerals tablet 1 tablet  1 tablet Oral Daily Clapacs, Jackquline Denmark, MD   1 tablet at 11/24/18 0806  . OLANZapine zydis (ZYPREXA) disintegrating tablet 20 mg  20 mg Oral QHS Clapacs, John T, MD      . risperiDONE (RISPERDAL M-TABS) disintegrating tablet 1 mg  1 mg Oral Q6H PRN Clapacs, Jackquline Denmark, MD   1 mg at 11/20/18 1313  . sertraline (ZOLOFT) tablet 50 mg  50 mg Oral QHS He, Jun, MD   50 mg at 11/23/18 2120  . traZODone (DESYREL) tablet 50 mg  50 mg Oral  QHS Catalina Gravel, NP   50 mg at 11/23/18 2120   PTA Medications: Medications Prior to Admission  Medication Sig Dispense Refill Last Dose  . diltiazem (CARDIZEM) 60 MG tablet Take 60 mg by mouth every 12 (twelve) hours.     . fexofenadine (ALLEGRA) 180 MG tablet Take 180 mg by mouth as needed.     . hydrOXYzine (ATARAX/VISTARIL) 25 MG tablet Take 25 mg by mouth every 6 (six) hours as needed for sleep.   11/09/2018 at 0700  . ibuprofen (ADVIL,MOTRIN) 200 MG tablet Take 600 mg by mouth every 6 (six) hours as needed for pain.     Marland Kitchen sertraline (ZOLOFT) 50 MG tablet Take 25 mg by mouth daily.     . traZODone (DESYREL) 50 MG tablet Take 50-100 mg by mouth at bedtime.       Patient Stressors: Financial difficulties Marital or family conflict Medication change or noncompliance Occupational concerns  Patient Strengths: Average or above average Radio producer for treatment/growth Religious Affiliation  Treatment Modalities: Medication Management, Group therapy, Case management,  1 to 1 session with clinician, Psychoeducation, Recreational therapy.   Physician Treatment Plan for Primary Diagnosis: Brief reactive psychosis (HCC) Long Term Goal(s): Improvement in symptoms so as ready for discharge Improvement  in symptoms so as ready for discharge   Short Term Goals: Ability to verbalize feelings will improve Ability to demonstrate self-control will improve Ability to identify and develop effective coping behaviors will improve Compliance with prescribed medications will improve  Medication Management: Evaluate patient's response, side effects, and tolerance of medication regimen.  Therapeutic Interventions: 1 to 1 sessions, Unit Group sessions and Medication administration.  Evaluation of Outcomes: Progressing  Physician Treatment Plan for Secondary Diagnosis: Principal Problem:   Brief reactive psychosis (HCC) Active Problems:   Depression    Anxiety state  Long Term Goal(s): Improvement in symptoms so as ready for discharge Improvement in symptoms so as ready for discharge   Short Term Goals: Ability to verbalize feelings will improve Ability to demonstrate self-control will improve Ability to identify and develop effective coping behaviors will improve Compliance with prescribed medications will improve     Medication Management: Evaluate patient's response, side effects, and tolerance of medication regimen.  Therapeutic Interventions: 1 to 1 sessions, Unit Group sessions and Medication administration.  Evaluation of Outcomes: Progressing   RN Treatment Plan for Primary Diagnosis: Brief reactive psychosis (HCC) Long Term Goal(s): Knowledge of disease and therapeutic regimen to maintain health will improve  Short Term Goals: Ability to demonstrate self-control, Ability to participate in decision making will improve, Ability to verbalize feelings will improve, Ability to disclose and discuss suicidal ideas and Ability to identify and develop effective coping behaviors will improve  Medication Management: RN will administer medications as ordered by provider, will assess and evaluate patient's response and provide education to patient for prescribed medication. RN will report any adverse and/or side effects to prescribing provider.  Therapeutic Interventions: 1 on 1 counseling sessions, Psychoeducation, Medication administration, Evaluate responses to treatment, Monitor vital signs and CBGs as ordered, Perform/monitor CIWA, COWS, AIMS and Fall Risk screenings as ordered, Perform wound care treatments as ordered.  Evaluation of Outcomes: Progressing   LCSW Treatment Plan for Primary Diagnosis: Brief reactive psychosis (HCC) Long Term Goal(s): Safe transition to appropriate next level of care at discharge, Engage patient in therapeutic group addressing interpersonal concerns.  Short Term Goals: Engage patient in aftercare  planning with referrals and resources, Increase social support, Increase ability to appropriately verbalize feelings, Increase emotional regulation and Facilitate acceptance of mental health diagnosis and concerns  Therapeutic Interventions: Assess for all discharge needs, 1 to 1 time with Social worker, Explore available resources and support systems, Assess for adequacy in community support network, Educate family and significant other(s) on suicide prevention, Complete Psychosocial Assessment, Interpersonal group therapy.  Evaluation of Outcomes: Progressing   Progress in Treatment: Attending groups: Yes. Participating in groups: Yes. Taking medication as prescribed: Yes. Toleration medication: Yes. Family/Significant other contact made: Yes, individual(s) contacted:  SPE completed with the patient's husband.   Patient understands diagnosis: No. Discussing patient identified problems/goals with staff: Yes. Medical problems stabilized or resolved: Yes. Denies suicidal/homicidal ideation: Yes. Issues/concerns per patient self-inventory: No. Other: none  New problem(s) identified: No, Describe:  none  New Short Term/Long Term Goal(s): elimination of symptoms of psychosis, medication management for mood stabilization; elimination of SI thoughts; development of comprehensive mental wellness plan.  Patient Goals:  "get better and go home.  Rest. I wasn't sleeping."  Discharge Plan or Barriers: Patient at this time is remains inappropriate for discharge. Patient remains diorganized and at times is hyper-religious and responding to internal stimuli, however, she appears to have moments where she is clear and rational in thinking.  Reason for Continuation of Hospitalization: Anxiety Depression Mania Medication stabilization  Estimated Length of Stay: 1-5 days  Recreational Therapy: Patient Stressors: N/A Patient Goal: Patient will engage in groups without prompting or  encouragement from LRT x3 group sessions within 5 recreation therapy group sessions  Attendees: Patient: Ashley Griffin 11/24/2018 2:36 PM  Physician: Dr. Toni Amend, MD 11/24/2018 2:36 PM  Nursing: 11/24/2018 2:36 PM  RN Care Manager: 11/24/2018 2:36 PM  Social Worker: [name], LCSW 11/24/2018 2:36 PM  Recreational Therapist:  11/24/2018 2:36 PM  Other:  11/24/2018 2:36 PM  Other:  11/24/2018 2:36 PM  Other: 11/24/2018 2:36 PM    Scribe for Treatment Team: Harden Mo, LCSW 11/24/2018 2:36 PM

## 2018-11-24 NOTE — Plan of Care (Signed)
Pt seen pacing at the beginning of the shift up until bedtime. Pt responding to stimuli and talking to herself at times. Other times, Pt is seen at least once standing fixed near the nurses station like she is in a trance with her head bowed and a sad look on her face. Pt refused to engage with me initially and avoided coming to me unless a staff member was present. Pt appeared suspicious of me and would not walk past a door because I was standing close to it. Pt reassured of my presence and support as her caregiver. Pt was later able to come to me to request her medication which she took with a lot of suspicious questions and also wanting me to look up her meds from a particular computer because "that was the computer all her nurses used and it would have her correct medications". Pt denies SI/HI/AVH. Q15 minute safety checks maintained. Pt is medication compliant.  Problem: Education: Goal: Utilization of techniques to improve thought processes will improve Outcome: Progressing Goal: Knowledge of the prescribed therapeutic regimen will improve Outcome: Progressing   Problem: Coping: Goal: Coping ability will improve Outcome: Progressing Goal: Will verbalize feelings Outcome: Progressing   Problem: Health Behavior/Discharge Planning: Goal: Identification of resources available to assist in meeting health care needs will improve Outcome: Progressing   Problem: Self-Concept: Goal: Will verbalize positive feelings about self Outcome: Progressing   Problem: Coping: Goal: Coping ability will improve Outcome: Progressing   Problem: Education: Goal: Knowledge of [place] [facility] Education information/materials will improve Outcome: Progressing Goal: Emotional status will improve Outcome: Progressing Goal: Mental status will improve Outcome: Progressing Goal: Verbalization of understanding the information provided will improve Outcome: Progressing   Problem: Activity: Goal: Interest  or engagement in activities will improve Outcome: Progressing Goal: Sleeping patterns will improve Outcome: Progressing   Problem: Coping: Goal: Ability to verbalize frustrations and anger appropriately will improve Outcome: Progressing Goal: Ability to demonstrate self-control will improve Outcome: Progressing   Problem: Health Behavior/Discharge Planning: Goal: Identification of resources available to assist in meeting health care needs will improve Outcome: Progressing Goal: Compliance with treatment plan for underlying cause of condition will improve Outcome: Progressing   Problem: Physical Regulation: Goal: Ability to maintain clinical measurements within normal limits will improve Outcome: Progressing   Problem: Safety: Goal: Periods of time without injury will increase Outcome: Progressing   Problem: Activity: Goal: Will verbalize the importance of balancing activity with adequate rest periods Outcome: Progressing   Problem: Education: Goal: Will be free of psychotic symptoms Outcome: Progressing Goal: Knowledge of the prescribed therapeutic regimen will improve Outcome: Progressing   Problem: Coping: Goal: Coping ability will improve Outcome: Progressing Goal: Will verbalize feelings Outcome: Progressing   Problem: Health Behavior/Discharge Planning: Goal: Compliance with prescribed medication regimen will improve Outcome: Progressing   Problem: Nutritional: Goal: Ability to achieve adequate nutritional intake will improve Outcome: Progressing   Problem: Role Relationship: Goal: Ability to communicate needs accurately will improve Outcome: Progressing Goal: Ability to interact with others will improve Outcome: Progressing   Problem: Safety: Goal: Ability to redirect hostility and anger into socially appropriate behaviors will improve Outcome: Progressing Goal: Ability to remain free from injury will improve Outcome: Progressing   Problem:  Self-Care: Goal: Ability to participate in self-care as condition permits will improve Outcome: Progressing   Problem: Self-Concept: Goal: Will verbalize positive feelings about self Outcome: Progressing

## 2018-11-24 NOTE — Progress Notes (Signed)
[facility] MD Progress Note  11/24/2018 4:16 PM Ashley Griffin  MRN:  [id number] Subjective: Follow-up for this woman who presented with psychotic features and who is continuing to endorse psychosis and display bizarre psychotic behavior.  Patient seen chart reviewed.  Patient was again able to at least sit down and have a conversation today but her thoughts remain very disorganized.  She tells me that she can "hear everything" and that she hears all the sounds happening around her and basically that she is hearing a lot of voices.  She begins crying during the interview.  Admits that she is feeling very confused.  Feels very hopeless.  Denies suicidal ideation.  Unable to lucidly follow a conversation.  I tried to talk with her about medication pointing out how her medicine seems to have been of some improvement to her so far but her insight is poor and she continues to think she is having "side effects" from the medicine.  I attempted to discuss ECT with the patient.  The more I see this illness in the morning I am convinced that this is probably best seen as a psychotic depression.  I explained this to the patient and explained the improved efficacy of ECT.  Offered to start on Monday.  Patient was able to sit during the conversation but clearly was not following every part of it.  She got confused between ECT and a CT scan.  Eventually we cleared that up but I am not sure she still understood ECT.  I had Karlene Lineman the head nurse from ECT speak with the patient briefly and that did not succeed as far as I can tell in making yet for better insight.  Patient continues to have poor insight and understanding psychosis and inability to care for herself. Principal Problem: Major depression single severe psychotic Diagnosis: Principal Problem:   Brief reactive psychosis (HCC) Active Problems:   Depression   Anxiety state  Total Time spent with patient: 45 minutes  Past Psychiatric History: Patient had had no  significant known past psychiatric history before this episode  Past Medical History: History reviewed. No pertinent past medical history. History reviewed. No pertinent surgical history. Family History: History reviewed. No pertinent family history. Family Psychiatric  History: None known Social History:  Social History   Substance and Sexual Activity  Alcohol Use Not Currently  . Frequency: Never     Social History   Substance and Sexual Activity  Drug Use Not on file    Social History   Socioeconomic History  . Marital status: Married    Spouse name: Not on file  . Number of children: Not on file  . Years of education: Not on file  . Highest education level: Not on file  Occupational History  . Not on file  Social Needs  . Financial resource strain: Not on file  . Food insecurity:    Worry: Not on file    Inability: Not on file  . Transportation needs:    Medical: Not on file    Non-medical: Not on file  Tobacco Use  . Smoking status: Never Smoker  . Smokeless tobacco: Never Used  Substance and Sexual Activity  . Alcohol use: Not Currently    Frequency: Never  . Drug use: Not on file  . Sexual activity: Not on file  Lifestyle  . Physical activity:    Days per week: Not on file    Minutes per session: Not on file  . Stress:  Not on file  Relationships  . Social connections:    Talks on phone: Not on file    Gets together: Not on file    Attends religious service: Not on file    Active member of club or organization: Not on file    Attends meetings of clubs or organizations: Not on file    Relationship status: Not on file  Other Topics Concern  . Not on file  Social History Narrative  . Not on file   Additional Social History:                         Sleep: Fair  Appetite:  Fair  Current Medications: Current Facility-Administered Medications  Medication Dose Route Frequency Provider Last Rate Last Dose  . acetaminophen (TYLENOL) tablet  650 mg  650 mg Oral Q6H PRN Catalina Gravel, NP   650 mg at 11/17/18 0819  . alum & mag hydroxide-simeth (MAALOX/MYLANTA) 200-200-20 MG/5ML suspension 30 mL  30 mL Oral Q4H PRN Thomspon, Adela Lank, NP      . feeding supplement (ENSURE ENLIVE) (ENSURE ENLIVE) liquid 237 mL  237 mL Oral BID BM Clapacs, John T, MD   237 mL at 11/24/18 1000  . hydrOXYzine (ATARAX/VISTARIL) tablet 25 mg  25 mg Oral Q6H PRN Catalina Gravel, NP   25 mg at 11/20/18 0223  . ibuprofen (ADVIL,MOTRIN) tablet 400 mg  400 mg Oral Q6H PRN Catalina Gravel, NP   400 mg at 11/18/18 0755  . [START ON 11/25/2018] lisinopril (PRINIVIL,ZESTRIL) tablet 20 mg  20 mg Oral Daily Clapacs, John T, MD      . loratadine (CLARITIN) tablet 10 mg  10 mg Oral Daily Catalina Gravel, NP   10 mg at 11/24/18 0804  . LORazepam (ATIVAN) tablet 1 mg  1 mg Oral Q4H PRN He, Jun, MD   1 mg at 11/22/18 1829  . magnesium hydroxide (MILK OF MAGNESIA) suspension 30 mL  30 mL Oral Daily PRN Catalina Gravel, NP      . multivitamin with minerals tablet 1 tablet  1 tablet Oral Daily Clapacs, Jackquline Denmark, MD   1 tablet at 11/24/18 0806  . OLANZapine zydis (ZYPREXA) disintegrating tablet 20 mg  20 mg Oral QHS Clapacs, John T, MD      . risperiDONE (RISPERDAL M-TABS) disintegrating tablet 1 mg  1 mg Oral Q6H PRN Clapacs, John T, MD   1 mg at 11/24/18 1450  . sertraline (ZOLOFT) tablet 50 mg  50 mg Oral QHS He, Jun, MD   50 mg at 11/23/18 2120  . traZODone (DESYREL) tablet 50 mg  50 mg Oral QHS Catalina Gravel, NP   50 mg at 11/23/18 2120    Lab Results: No results found for this or any previous visit (from the past 48 hour(s)).  Blood Alcohol level:  Lab Results  Component Value Date   ETH <10 11/09/2018    Metabolic Disorder Labs: No results found for: HGBA1C, MPG No results found for: PROLACTIN No results found for: CHOL, TRIG, HDL, CHOLHDL, VLDL, LDLCALC  Physical Findings: AIMS: Facial and Oral Movements Muscles of Facial  Expression: None, normal Lips and Perioral Area: None, normal Jaw: None, normal Tongue: None, normal,Extremity Movements Upper (arms, wrists, hands, fingers): None, normal Lower (legs, knees, ankles, toes): None, normal, Trunk Movements Neck, shoulders, hips: None, normal, Overall Severity Severity of abnormal movements (highest score from questions above): None, normal Incapacitation due to abnormal movements: None, normal Patient's awareness of  abnormal movements (rate only patient's report): No Awareness, Dental Status Current problems with teeth and/or dentures?: No Does patient usually wear dentures?: No  CIWA:    COWS:  COWS Total Score: 6  Musculoskeletal: Strength & Muscle Tone: within normal limits Gait & Station: normal Patient leans: N/A  Psychiatric Specialty Exam: Physical Exam  Nursing note and vitals reviewed. Constitutional: She appears well-developed and well-nourished.  HENT:  Head: Normocephalic and atraumatic.  Eyes: Pupils are equal, round, and reactive to light. Conjunctivae are normal.  Neck: Normal range of motion.  Cardiovascular: Regular rhythm and normal heart sounds.  Respiratory: Effort normal.  GI: Soft.  Musculoskeletal: Normal range of motion.  Neurological: She is alert.  Skin: Skin is warm and dry.  Psychiatric: Her mood appears anxious. Her affect is labile. Her speech is delayed and tangential. She is agitated. She is not aggressive. Thought content is paranoid and delusional. Cognition and memory are impaired. She expresses impulsivity and inappropriate judgment. She exhibits a depressed mood.    Review of Systems  Constitutional: Negative.   HENT: Negative.   Eyes: Negative.   Respiratory: Negative.   Cardiovascular: Negative.   Gastrointestinal: Negative.   Musculoskeletal: Negative.   Skin: Negative.   Neurological: Negative.   Psychiatric/Behavioral: Positive for depression, hallucinations and memory loss. Negative for substance  abuse and suicidal ideas. The patient is nervous/anxious and has insomnia.     Blood pressure (!) 147/94, pulse 70, temperature 98.7 F (37.1 C), temperature source Oral, resp. rate 17, height 5\' 1"  (1.549 m), weight 68.9 kg, last menstrual period 11/09/2018, SpO2 98 %.Body mass index is 28.72 kg/m.  General Appearance: Disheveled  Eye Contact:  Minimal  Speech:  Slow  Volume:  Decreased  Mood:  Anxious and Depressed  Affect:  Depressed and Tearful  Thought Process:  Disorganized  Orientation:  Negative  Thought Content:  Illogical, Hallucinations: Auditory, Ideas of Reference:   Paranoia and Paranoid Ideation  Suicidal Thoughts:  No  Homicidal Thoughts:  No  Memory:  Immediate;   Fair Recent;   Poor Remote;   Fair  Judgement:  Impaired  Insight:  Shallow  Psychomotor Activity:  Restlessness  Concentration:  Concentration: Poor  Recall:  Poor  Fund of Knowledge:  Fair  Language:  Poor  Akathisia:  No  Handed:  Right  AIMS (if indicated):     Assets:  Desire for Improvement Financial Resources/Insurance Housing Physical Health Resilience Social Support  ADL's:  Impaired  Cognition:  Impaired,  Mild  Sleep:  Number of Hours: 6.15     Treatment Plan Summary: Daily contact with patient to assess and evaluate symptoms and progress in treatment, Medication management and Plan 38 year old woman with psychotic disorder.  After switching antipsychotic to olanzapine she seems to have calm down some of the agitation but remains paranoid confused and hallucinating.  I suggested to the patient that electroconvulsive therapy would be the most effective treatment and offered to plan to start treatment on Monday but the patient was not able to have a lucid enough conversation to consent and remains nervous about the idea.  Meanwhile we will increase the dose of both of her antidepressant and her antipsychotic.  Tried to keep the patient informed with psychoeducation and supportive  counseling and therapy.  The patient during the interview today he informed me that I was "God".  This is a pretty good example of how much psychosis she still has.  Mordecai RasmussenJohn Clapacs, MD 11/24/2018, 4:16 PM

## 2018-11-24 NOTE — [facility] Group Notes (Signed)
LCSW Group Therapy Note  11/24/2018 1:00 PM  Type of Therapy and Topic:  Group Therapy:  Feelings around Relapse and Recovery  Participation Level:  Did Not Attend   Description of Group:    Patients in this group will discuss emotions they experience before and after a relapse. They will process how experiencing these feelings, or avoidance of experiencing them, relates to having a relapse. Facilitator will guide patients to explore emotions they have related to recovery. Patients will be encouraged to process which emotions are more powerful. They will be guided to discuss the emotional reaction significant others in their lives may have to their relapse or recovery. Patients will be assisted in exploring ways to respond to the emotions of others without this contributing to a relapse.  Therapeutic Goals: 1. Patient will identify two or more emotions that lead to a relapse for them 2. Patient will identify two emotions that result when they relapse 3. Patient will identify two emotions related to recovery 4. Patient will demonstrate ability to communicate their needs through discussion and/or role plays   Summary of Patient Progress:  X  Therapeutic Modalities:   Cognitive Behavioral Therapy Solution-Focused Therapy Assertiveness Training Relapse Prevention Therapy   [name], MSW, LCSW 11/24/2018 2:25 PM

## 2018-11-24 NOTE — Progress Notes (Signed)
Recreation Therapy Notes  Date: 11/24/2018  Time: 9:30 am   Location: Craft room   Behavioral response: N/A   Intervention Topic: Leisure  Discussion/Intervention: Patient did not attend group.   Clinical Observations/Feedback:  Patient did not attend group.   Ashley Griffin LRT/CTRS        Ashley Griffin 11/24/2018 11:08 AM

## 2018-11-24 NOTE — Plan of Care (Signed)
Pt denies SI, HI, denies depression, anxiety is 5/10 and she said "I slept". She did sleep 6 hours and 15 mins. Pt was educated on care plan and goals. She could not give me a goal today. Torrie Mayers RN Problem: Education: Goal: Utilization of techniques to improve thought processes will improve 11/24/2018 1150 by Chalmers Cater, RN Outcome: Progressing 11/24/2018 1058 by Chalmers Cater, RN Outcome: Not Progressing Goal: Knowledge of the prescribed therapeutic regimen will improve 11/24/2018 1150 by Chalmers Cater, RN Outcome: Progressing 11/24/2018 1058 by Chalmers Cater, RN Outcome: Not Progressing   Problem: Coping: Goal: Coping ability will improve 11/24/2018 1150 by Chalmers Cater, RN Outcome: Progressing 11/24/2018 1058 by Chalmers Cater, RN Outcome: Progressing Goal: Will verbalize feelings 11/24/2018 1150 by Chalmers Cater, RN Outcome: Progressing 11/24/2018 1058 by Chalmers Cater, RN Outcome: Progressing   Problem: Health Behavior/Discharge Planning: Goal: Identification of resources available to assist in meeting health care needs will improve 11/24/2018 1150 by Chalmers Cater, RN Outcome: Progressing 11/24/2018 1058 by Chalmers Cater, RN Outcome: Progressing   Problem: Self-Concept: Goal: Will verbalize positive feelings about self 11/24/2018 1150 by Chalmers Cater, RN Outcome: Not Progressing 11/24/2018 1058 by Chalmers Cater, RN Outcome: Not Progressing    Problem: Coping: Goal: Coping ability will improve 11/24/2018 1150 by Chalmers Cater, RN Outcome: Progressing 11/24/2018 1058 by Chalmers Cater, RN Outcome: Progressing   Problem: Education: Goal: Knowledge of [place] [facility] Education information/materials will improve 11/24/2018 1150 by Chalmers Cater, RN Outcome: Progressing 11/24/2018 1058 by Chalmers Cater, RN Outcome: Progressing Goal: Emotional status will improve 11/24/2018 1150 by Chalmers Cater, RN Outcome: Not Progressing 11/24/2018 1058 by Chalmers Cater, RN Outcome: Not Progressing Goal: Mental status will improve 11/24/2018 1150 by Chalmers Cater, RN Outcome: Not Progressing 11/24/2018 1058 by Chalmers Cater, RN Outcome: Not Progressing Goal: Verbalization of understanding the information provided will improve 11/24/2018 1150 by Chalmers Cater, RN Outcome: Not Progressing 11/24/2018 1058 by Chalmers Cater, RN Outcome: Not Progressing   Problem: Education: Goal: Knowledge of [place] [facility] Education information/materials will improve 11/24/2018 1150 by Chalmers Cater, RN Outcome: Progressing 11/24/2018 1058 by Chalmers Cater, RN Outcome: Progressing Goal: Emotional status will improve 11/24/2018 1150 by Chalmers Cater, RN Outcome: Not Progressing 11/24/2018 1058 by Chalmers Cater, RN Outcome: Not Progressing Goal: Mental status will improve 11/24/2018 1150 by Chalmers Cater, RN Outcome: Not Progressing 11/24/2018 1058 by Chalmers Cater, RN Outcome: Not Progressing Goal: Verbalization of understanding the information provided will improve 11/24/2018 1150 by Chalmers Cater, RN Outcome: Not Progressing 11/24/2018 1058 by Chalmers Cater, RN Outcome: Not Progressing   Problem: Education: Goal: Knowledge of [place] [facility] Education information/materials will improve 11/24/2018 1150 by Chalmers Cater, RN Outcome: Progressing 11/24/2018 1058 by Chalmers Cater, RN Outcome: Progressing Goal: Emotional status will improve 11/24/2018 1150 by Chalmers Cater, RN Outcome: Not Progressing 11/24/2018 1058 by Chalmers Cater, RN Outcome: Not Progressing Goal: Mental status will improve 11/24/2018 1150 by Chalmers Cater, RN Outcome: Not Progressing 11/24/2018 1058 by Chalmers Cater, RN Outcome: Not Progressing Goal: Verbalization of understanding the information provided will improve 11/24/2018 1150 by Chalmers Cater, RN Outcome: Not Progressing 11/24/2018 1058 by Chalmers Cater, RN Outcome: Not Progressing   Problem:  Activity: Goal: Interest or engagement in activities will improve 11/24/2018 1150 by Chalmers Cater, RN  Outcome: Progressing 11/24/2018 1058 by Chalmers Cater, RN Outcome: Progressing Goal: Sleeping patterns will improve 11/24/2018 1150 by Chalmers Cater, RN Outcome: Progressing 11/24/2018 1058 by Chalmers Cater, RN Outcome: Progressing   Problem: Coping: Goal: Ability to verbalize frustrations and anger appropriately will improve 11/24/2018 1150 by Chalmers Cater, RN Outcome: Progressing 11/24/2018 1058 by Chalmers Cater, RN Outcome: Progressing Goal: Ability to demonstrate self-control will improve 11/24/2018 1150 by Chalmers Cater, RN Outcome: Progressing 11/24/2018 1058 by Chalmers Cater, RN Outcome: Progressing   Problem: Health Behavior/Discharge Planning: Goal: Identification of resources available to assist in meeting health care needs will improve 11/24/2018 1150 by Chalmers Cater, RN Outcome: Progressing 11/24/2018 1058 by Chalmers Cater, RN Outcome: Progressing Goal: Compliance with treatment plan for underlying cause of condition will improve 11/24/2018 1150 by Chalmers Cater, RN Outcome: Progressing 11/24/2018 1058 by Chalmers Cater, RN Outcome: Progressing   Problem: Physical Regulation: Goal: Ability to maintain clinical measurements within normal limits will improve 11/24/2018 1150 by Chalmers Cater, RN Outcome: Progressing 11/24/2018 1058 by Chalmers Cater, RN Outcome: Progressing   Problem: Activity: Goal: Will verbalize the importance of balancing activity with adequate rest periods 11/24/2018 1150 by Chalmers Cater, RN Outcome: Progressing 11/24/2018 1058 by Chalmers Cater, RN Outcome: Progressing   Problem: Education: Goal: Will be free of psychotic symptoms 11/24/2018 1150 by Chalmers Cater, RN Outcome: Not Progressing 11/24/2018 1058 by Chalmers Cater, RN Outcome: Progressing Goal: Knowledge of the prescribed therapeutic regimen will improve 11/24/2018 1150  by Chalmers Cater, RN Outcome: Progressing 11/24/2018 1058 by Chalmers Cater, RN Outcome: Progressing   Problem: Coping: Goal: Coping ability will improve 11/24/2018 1150 by Chalmers Cater, RN Outcome: Not Progressing 11/24/2018 1058 by Chalmers Cater, RN Outcome: Progressing Goal: Will verbalize feelings 11/24/2018 1150 by Chalmers Cater, RN Outcome: Progressing 11/24/2018 1058 by Chalmers Cater, RN Outcome: Progressing   Problem: Health Behavior/Discharge Planning: Goal: Compliance with prescribed medication regimen will improve 11/24/2018 1150 by Chalmers Cater, RN Outcome: Progressing 11/24/2018 1058 by Chalmers Cater, RN Outcome: Progressing   Problem: Nutritional: Goal: Ability to achieve adequate nutritional intake will improve 11/24/2018 1150 by Chalmers Cater, RN Outcome: Progressing 11/24/2018 1058 by Chalmers Cater, RN Outcome: Progressing   Problem: Role Relationship: Goal: Ability to communicate needs accurately will improve 11/24/2018 1150 by Chalmers Cater, RN Outcome: Progressing 11/24/2018 1058 by Chalmers Cater, RN Outcome: Progressing Goal: Ability to interact with others will improve 11/24/2018 1150 by Chalmers Cater, RN Outcome: Progressing 11/24/2018 1058 by Chalmers Cater, RN Outcome: Progressing   Problem: Safety: Goal: Ability to redirect hostility and anger into socially appropriate behaviors will improve 11/24/2018 1150 by Chalmers Cater, RN Outcome: Progressing 11/24/2018 1058 by Chalmers Cater, RN Outcome: Progressing Goal: Ability to remain free from injury will improve 11/24/2018 1150 by Chalmers Cater, RN Outcome: Progressing 11/24/2018 1058 by Chalmers Cater, RN Outcome: Progressing   Problem: Self-Care: Goal: Ability to participate in self-care as condition permits will improve 11/24/2018 1150 by Chalmers Cater, RN Outcome: Progressing 11/24/2018 1058 by Chalmers Cater, RN Outcome: Progressing   Problem: Self-Concept: Goal: Will verbalize  positive feelings about self 11/24/2018 1150 by Chalmers Cater, RN Outcome: Not Progressing 11/24/2018 1058 by Chalmers Cater, RN Outcome: Progressing

## 2018-11-24 NOTE — Progress Notes (Signed)
Recreation Therapy Notes  Patient made her way to the exit door and attempted to leave the unit. She was redirected by Clinical research associate and informed she was not discharging today. Patient twisted the door handle and explained she was leaving. Writer once again explained to her that she was not leaving today. Patient eventually made her way to the day room and then finally went to her room.         Rufino Staup 11/24/2018 4:31 PM

## 2018-11-24 NOTE — Progress Notes (Signed)
Ashley Griffin called today wanting an update on his wife Ashley Griffin. I answered all his questions and updated him on her care plan and progression. I met him out from to get her belongings (clothes, books). He informed me that patient's grandmother died in her forties from a brain tumor and before she died she had mental issues. He said that if Ashley Griffin needed a CT scan it was okay with him if she was sedated for it. I told him that I would make a note and relay the information.He also told me that Ashley Griffin has only slept for four hours a night the three weeks before she was admitted here.  Collier Bullock RN

## 2018-11-25 NOTE — Plan of Care (Signed)
Pt. Is complaint with medications with direction and encouragement. Pt. Able to remain safe on the unit monitored by staff per MD orders. Pt. Denies si/hi/avh, contracts for safety. Pt. Continues to present with confusion and disorganized thought process.    Problem: Education: Goal: Will be free of psychotic symptoms Outcome: Not Progressing   Problem: Health Behavior/Discharge Planning: Goal: Compliance with prescribed medication regimen will improve Outcome: Progressing   Problem: Safety: Goal: Ability to remain free from injury will improve Outcome: Progressing

## 2018-11-25 NOTE — Plan of Care (Signed)
The patient continues to have difficulty connecting with where she is and what is happening. She utters fantastical unreality with some amount of improvement. She does interact more with other patients, also ambulates in hallway and recognizes some staff. Problem: Education: Goal: Utilization of techniques to improve thought processes will improve Outcome: Not Progressing Goal: Knowledge of the prescribed therapeutic regimen will improve Outcome: Not Progressing   Problem: Coping: Goal: Coping ability will improve Outcome: Not Progressing Goal: Will verbalize feelings Outcome: Not Progressing   Problem: Health Behavior/Discharge Planning: Goal: Identification of resources available to assist in meeting health care needs will improve Outcome: Not Progressing   Problem: Self-Concept: Goal: Will verbalize positive feelings about self Outcome: Not Progressing   Problem: Coping: Goal: Coping ability will improve Outcome: Not Progressing   Problem: Education: Goal: Knowledge of [place] [facility] Education information/materials will improve Outcome: Not Progressing Goal: Emotional status will improve Outcome: Not Progressing Goal: Mental status will improve Outcome: Not Progressing Goal: Verbalization of understanding the information provided will improve Outcome: Not Progressing   Problem: Activity: Goal: Interest or engagement in activities will improve Outcome: Not Progressing Goal: Sleeping patterns will improve Outcome: Not Progressing   Problem: Coping: Goal: Ability to verbalize frustrations and anger appropriately will improve Outcome: Not Progressing Goal: Ability to demonstrate self-control will improve Outcome: Not Progressing   Problem: Health Behavior/Discharge Planning: Goal: Identification of resources available to assist in meeting health care needs will improve Outcome: Not Progressing Goal: Compliance with treatment plan for underlying cause of condition  will improve Outcome: Not Progressing   Problem: Physical Regulation: Goal: Ability to maintain clinical measurements within normal limits will improve Outcome: Not Progressing   Problem: Safety: Goal: Periods of time without injury will increase Outcome: Not Progressing   Problem: Activity: Goal: Will verbalize the importance of balancing activity with adequate rest periods Outcome: Not Progressing   Problem: Education: Goal: Will be free of psychotic symptoms Outcome: Not Progressing Goal: Knowledge of the prescribed therapeutic regimen will improve Outcome: Not Progressing   Problem: Coping: Goal: Coping ability will improve Outcome: Not Progressing Goal: Will verbalize feelings Outcome: Not Progressing   Problem: Health Behavior/Discharge Planning: Goal: Compliance with prescribed medication regimen will improve Outcome: Not Progressing   Problem: Nutritional: Goal: Ability to achieve adequate nutritional intake will improve Outcome: Not Progressing   Problem: Role Relationship: Goal: Ability to communicate needs accurately will improve Outcome: Not Progressing Goal: Ability to interact with others will improve Outcome: Not Progressing   Problem: Safety: Goal: Ability to redirect hostility and anger into socially appropriate behaviors will improve Outcome: Not Progressing Goal: Ability to remain free from injury will improve Outcome: Not Progressing   Problem: Self-Care: Goal: Ability to participate in self-care as condition permits will improve Outcome: Not Progressing   Problem: Self-Concept: Goal: Will verbalize positive feelings about self Outcome: Not Progressing

## 2018-11-25 NOTE — Progress Notes (Signed)
D- Patient continues with anxiety,confusion, disorientation. Her mood seems depressed with minimal contact with other patients.. Denies SI, HI, AVH, and pain. "I feel like I ready to go". Goal: She has continued compliant with medications and asking how they may help her. A- Scheduled medications administered to patient, per MD orders. Support and encouragement provided.  Routine safety checks conducted every 15 minutes.  Patient informed to notify staff with problems or concerns. R- No adverse drug reactions noted. Patient contracts for safety at this time. Patient compliant with medications and treatment plan.  Patient remains safe at this time.

## 2018-11-25 NOTE — Progress Notes (Signed)
D: Pt during assessments denies SI/HI/AVH, but conversation is difficult to have due to her condition. Pt is mostly pleasant, but thought process is disorganized and blocking. Pt. Endorses confusion and anxiety, given PRN medication for comfort and safety. Pt. Visibly looks preoccupied like she is potentially responding to internal stimuli.    A: Q x 15 minute observation checks to be completed for safety. Patient was provided with education, but needs reinforcement.  Patient was given/offered medications per orders. Patient  was encourage to attend groups, participate in unit activities and continue with plan of care. Pt. Chart and plans of care reviewed. Pt. Given support and encouragement.   R: Patient is complaint with medications and unit procedures with lots of direction and encouragement. Pt. Behaviors are still impulsive and random.  Pt. Is eating good with encouragement and direction from staff. Pt. Observed frequently pacing around the unit looking confused.

## 2018-11-25 NOTE — Progress Notes (Signed)
[facility] MD Progress Note  11/25/2018 11:43 AM TAMEKKA GORES  MRN:  [id number] Subjective:  38yo F who presents in [facility] inpatient psychiatric unit due to psychosis. Chart reviewed, patient seen  Patient looks upset. She reports having a seasonal allergy. She cannot recall this provider from two weeks ago. She still appears confused. Her insight remains poor, she cannot explain why is she here in the unit. She reports she does not like a new medication (Zyprexa). She says she believes tha medication makes her confused. She says she did not like Seroquel either. She denies suicidal or homicidal thoughts. She is homesick. She says "nightime scares me"; she did not want to explain further - "I can't talk about it".  Patient wanted to talk to me again later. She was not able to formulate a particular question for me. She says she prays. She is missing her family. She is asking about discharge. She is scared to be in the unit, she "uotside does not scare me as much as insight".  Principal Problem: Brief reactive psychosis (HCC) Diagnosis: Principal Problem:   Brief reactive psychosis (HCC) Active Problems:   Depression   Anxiety state  Total Time spent with patient: 30 minutes  Past Psychiatric History: see admission H&P  Past Medical History: History reviewed. No pertinent past medical history. History reviewed. No pertinent surgical history. Family History: History reviewed. No pertinent family history. Family Psychiatric  History:  see admission H&P Social History:  Social History   Substance and Sexual Activity  Alcohol Use Not Currently  . Frequency: Never     Social History   Substance and Sexual Activity  Drug Use Not on file    Social History   Socioeconomic History  . Marital status: Married    Spouse name: Not on file  . Number of children: Not on file  . Years of education: Not on file  . Highest education level: Not on file  Occupational History  . Not on file  Social  Needs  . Financial resource strain: Not on file  . Food insecurity:    Worry: Not on file    Inability: Not on file  . Transportation needs:    Medical: Not on file    Non-medical: Not on file  Tobacco Use  . Smoking status: Never Smoker  . Smokeless tobacco: Never Used  Substance and Sexual Activity  . Alcohol use: Not Currently    Frequency: Never  . Drug use: Not on file  . Sexual activity: Not on file  Lifestyle  . Physical activity:    Days per week: Not on file    Minutes per session: Not on file  . Stress: Not on file  Relationships  . Social connections:    Talks on phone: Not on file    Gets together: Not on file    Attends religious service: Not on file    Active member of club or organization: Not on file    Attends meetings of clubs or organizations: Not on file    Relationship status: Not on file  Other Topics Concern  . Not on file  Social History Narrative  . Not on file   Additional Social History:                         Sleep: Poor  Appetite:  Fair  Current Medications: Current Facility-Administered Medications  Medication Dose Route Frequency Provider Last Rate Last Dose  . acetaminophen (  TYLENOL) tablet 650 mg  650 mg Oral Q6H PRN Catalina Gravel, NP   650 mg at 11/24/18 2226  . alum & mag hydroxide-simeth (MAALOX/MYLANTA) 200-200-20 MG/5ML suspension 30 mL  30 mL Oral Q4H PRN Thomspon, Adela Lank, NP      . feeding supplement (ENSURE ENLIVE) (ENSURE ENLIVE) liquid 237 mL  237 mL Oral BID BM Clapacs, Jackquline Denmark, MD   237 mL at 11/25/18 0952  . hydrOXYzine (ATARAX/VISTARIL) tablet 25 mg  25 mg Oral Q6H PRN Catalina Gravel, NP   25 mg at 11/20/18 0223  . ibuprofen (ADVIL,MOTRIN) tablet 400 mg  400 mg Oral Q6H PRN Catalina Gravel, NP   400 mg at 11/18/18 0755  . lisinopril (PRINIVIL,ZESTRIL) tablet 20 mg  20 mg Oral Daily Clapacs, Jackquline Denmark, MD   20 mg at 11/25/18 0820  . loratadine (CLARITIN) tablet 10 mg  10 mg Oral Daily  Catalina Gravel, NP   10 mg at 11/25/18 0820  . LORazepam (ATIVAN) tablet 1 mg  1 mg Oral Q4H PRN He, Jun, MD   1 mg at 11/25/18 0820  . magnesium hydroxide (MILK OF MAGNESIA) suspension 30 mL  30 mL Oral Daily PRN Catalina Gravel, NP      . multivitamin with minerals tablet 1 tablet  1 tablet Oral Daily Clapacs, Jackquline Denmark, MD   1 tablet at 11/25/18 0820  . OLANZapine zydis (ZYPREXA) disintegrating tablet 20 mg  20 mg Oral QHS Clapacs, Jackquline Denmark, MD   20 mg at 11/24/18 2126  . risperiDONE (RISPERDAL M-TABS) disintegrating tablet 1 mg  1 mg Oral Q6H PRN Clapacs, Jackquline Denmark, MD   1 mg at 11/25/18 2956  . sertraline (ZOLOFT) tablet 100 mg  100 mg Oral QHS Clapacs, Jackquline Denmark, MD   100 mg at 11/24/18 2126  . traZODone (DESYREL) tablet 50 mg  50 mg Oral QHS Catalina Gravel, NP   50 mg at 11/24/18 2126    Lab Results: No results found for this or any previous visit (from the past 48 hour(s)).  Blood Alcohol level:  Lab Results  Component Value Date   ETH <10 11/09/2018    Metabolic Disorder Labs: No results found for: HGBA1C, MPG No results found for: PROLACTIN No results found for: CHOL, TRIG, HDL, CHOLHDL, VLDL, LDLCALC  Physical Findings: AIMS: Facial and Oral Movements Muscles of Facial Expression: None, normal Lips and Perioral Area: None, normal Jaw: None, normal Tongue: None, normal,Extremity Movements Upper (arms, wrists, hands, fingers): None, normal Lower (legs, knees, ankles, toes): None, normal, Trunk Movements Neck, shoulders, hips: None, normal, Overall Severity Severity of abnormal movements (highest score from questions above): None, normal Incapacitation due to abnormal movements: None, normal Patient's awareness of abnormal movements (rate only patient's report): No Awareness, Dental Status Current problems with teeth and/or dentures?: No Does patient usually wear dentures?: No  CIWA:    COWS:  COWS Total Score: 6  Musculoskeletal: Strength & Muscle Tone: within  normal limits Gait & Station: normal Patient leans: N/A  Psychiatric Specialty Exam: Physical Exam  ROS  Blood pressure (!) 129/94, pulse 88, temperature 98.2 F (36.8 C), temperature source Oral, resp. rate 16, height  (1.549 m), weight 68.9 kg, last menstrual period 11/09/2018, SpO2 99 %.Body mass index is 28.72 kg/m.  General Appearance: Disheveled  Eye Contact:  Minimal  Speech:  Blocked  Volume:  Decreased  Mood:  Dysphoric  Affect:  Restricted and Tearful  Thought Process:  Disorganized  Orientation:  Other:  partial: place,  person, not time  Thought Content:  Illogical, Delusions, Obsessions, Paranoid Ideation and Rumination  Suicidal Thoughts:  No  Homicidal Thoughts:  No  Memory:  Immediate;   Fair Recent;   Fair Remote;   Fair  Judgement:  Impaired  Insight:  Lacking  Psychomotor Activity:  Psychomotor Retardation  Concentration:  Concentration: Poor and Attention Span: Poor  Recall:  [employer] of Knowledge:  Fair  Language:  Fair  Akathisia:  No  Handed:  Right  AIMS (if indicated):     Assets:  Desire for Improvement Financial Resources/Insurance Housing  ADL's:  Intact  Cognition:  Impaired,  Mild  Sleep:  Number of Hours: 3.25     Treatment Plan Summary: Daily contact with patient to assess and evaluate symptoms and progress in treatment   38yo F who presents in [facility] inpatient psychiatric unit due to psychosis. Patient continues to appear delusional, paranoid and depressed. She has no insight in her condition and her judgement remains poor. No current behavioral or safety concerns in the unit. Her antipsychotic was switched from Seroquel to olanzapine and she does not have periods of agitation lately. Main treatment team suggested to the patient that electroconvulsive therapy would be the most effective treatment and offered to plan to start treatment on Monday but the patient was not able to have a meaningful conversation to discuss that treatment.  Today patient still confused anbout her treatment options and is not able to discuss; will attempt to discuss with her again tomorrow.  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  Plan: -continue inpatient psych admission; 15-minute checks; daily contact with patient to assess and evaluate symptoms and progress in treatment.   -continue Zypreza 20mg  PO QHS for psychosis, Zoloft 100mg  PO QHS for depression, Trazodone 50mg  PO QHS for sleep.  -consider ECT; possible start on Monday 11/27/18.    -psychoeducation;    -Disposition: to be determined.  Thalia Party, MD 11/25/2018, 11:43 AM

## 2018-11-25 NOTE — [facility] Group Notes (Signed)
LCSW Group Therapy Note  11/25/2018 1:15pm  Type of Therapy and Topic:  Group Therapy:  Healthy Self Image and Positive Change  Participation Level:  Active   Description of Group:  In this group, patients will compare and contrast their current "I am...." statements to the visions they identify as desirable for their lives.  Patients discuss fears and how they can make positive changes in their cognitions that will positively impact their behaviors.  Facilitator played a motivational 3-minute speech and patients were left with the task of thinking about what "I am...." statements they can start using in their lives immediately.  Therapeutic Goals: 1. Patient will state their current self-perception as expressed in an "I Am" statement 2. Patient will contrast this with their desired vision for their live 3. Patient will identify 3 fears that negatively impact their behavior 4. Patient will discuss cognitive distortions that stem from their fears 5. Patient will verbalize statements that challenge their cognitive distortions  Summary of Patient Progress: The patient reported that  she feels "good." The patient stated, "I am persistent" Patient discussed her fears and how she can make positive changes in their cognitions that will positively impact her behaviors. Patient was able to discuss and process cognitive distortions that stem from her fears. Patient actively and appropriately engaged in the group. Patient was able to provide support and validation to other group members. Patient practiced active listening when interacting with the facilitator and other group members.     Therapeutic Modalities Cognitive Behavioral Therapy Motivational Interviewing  Johnnye Sima, LCSW 11/25/2018 12:32 PM

## 2018-11-25 NOTE — Progress Notes (Signed)
D- Patient continues to wean in and out as to her location and appropriate behavior. Continuously anxious and depressed mood.  Denies SI, HI, AVH, and pain. Quotes "I think I am pregnant." In setting goals they are fantastic and unrealistic.l. A- Scheduled medications administered to patient, per MD orders. Support and encouragement provided.  Routine safety checks conducted every 15 minutes.  Patient informed to notify staff with problems or concerns. R- No adverse drug reactions noted. Patient contracts for safety at this time. Patient compliant with medications and treatment plan. Patient receptive, calm, and cooperative. Patient interacts well with others on the unit.  Patient remains safe at this time.

## 2018-11-25 NOTE — Plan of Care (Signed)
Patient seemed improved in her interactions with others today, makes more direct eye contact and focused on her present topic. Ambulating in hallway and having actual short conversations with other people. Continues with adequate food intake and break room attendance. Problem: Coping: Goal: Coping ability will improve Outcome: Progressing Goal: Will verbalize feelings Outcome: Progressing   Problem: Self-Concept: Goal: Will verbalize positive feelings about self Outcome: Progressing   Problem: Coping: Goal: Coping ability will improve Outcome: Progressing

## 2018-11-26 NOTE — Plan of Care (Signed)
Patient  is knowledgeable of information received , able to verbalize understanding . Emotional and  mental status improved . Able to  verbalize no safety concerns . Thought process improving . Compliant  with medication given.  . Denies pain issues . Patient isolates to room  limiting socialization with peers . Attending unit  programing , able to vent positive  feelings  of self working on coping  skills . Patient denies suicidal ideation.   . Denies any sleeping  issues    Problem: Education: Goal: Utilization of techniques to improve thought processes will improve Outcome: Progressing Goal: Knowledge of the prescribed therapeutic regimen will improve Outcome: Progressing   Problem: Coping: Goal: Coping ability will improve Outcome: Progressing Goal: Will verbalize feelings Outcome: Progressing   Problem: Health Behavior/Discharge Planning: Goal: Identification of resources available to assist in meeting health care needs will improve Outcome: Progressing   Problem: Self-Concept: Goal: Will verbalize positive feelings about self Outcome: Progressing   Problem: Coping: Goal: Coping ability will improve Outcome: Progressing   Problem: Education: Goal: Knowledge of [facility] Education information/materials will improve Outcome: Progressing Goal: Emotional status will improve Outcome: Progressing Goal: Mental status will improve Outcome: Progressing Goal: Verbalization of understanding the information provided will improve Outcome: Progressing   Problem: Activity: Goal: Interest or engagement in activities will improve Outcome: Progressing Goal: Sleeping patterns will improve Outcome: Progressing   Problem: Coping: Goal: Ability to verbalize frustrations and anger appropriately will improve Outcome: Progressing Goal: Ability to demonstrate self-control will improve Outcome: Progressing   Problem: Health Behavior/Discharge Planning: Goal: Identification of  resources available to assist in meeting health care needs will improve Outcome: Progressing Goal: Compliance with treatment plan for underlying cause of condition will improve Outcome: Progressing   Problem: Physical Regulation: Goal: Ability to maintain clinical measurements within normal limits will improve Outcome: Progressing   Problem: Safety: Goal: Periods of time without injury will increase Outcome: Progressing   Problem: Activity: Goal: Will verbalize the importance of balancing activity with adequate rest periods Outcome: Progressing   Problem: Education: Goal: Will be free of psychotic symptoms Outcome: Progressing Goal: Knowledge of the prescribed therapeutic regimen will improve Outcome: Progressing   Problem: Coping: Goal: Coping ability will improve Outcome: Progressing Goal: Will verbalize feelings Outcome: Progressing   Problem: Health Behavior/Discharge Planning: Goal: Compliance with prescribed medication regimen will improve Outcome: Progressing   Problem: Nutritional: Goal: Ability to achieve adequate nutritional intake will improve Outcome: Progressing   Problem: Role Relationship: Goal: Ability to communicate needs accurately will improve Outcome: Progressing Goal: Ability to interact with others will improve Outcome: Progressing   Problem: Safety: Goal: Ability to redirect hostility and anger into socially appropriate behaviors will improve Outcome: Progressing Goal: Ability to remain free from injury will improve Outcome: Progressing   Problem: Self-Care: Goal: Ability to participate in self-care as condition permits will improve Outcome: Progressing   Problem: Self-Concept: Goal: Will verbalize positive feelings about self Outcome: Progressing

## 2018-11-26 NOTE — [facility] Group Notes (Signed)
LCSW Group Therapy Note 11/26/2018 1:15pm  Type of Therapy and Topic: Group Therapy: Feelings Around Returning Home & Establishing a Supportive Framework and Supporting Oneself When Supports Not Available  Participation Level: Did Not Attend  Description of Group:  Patients first processed thoughts and feelings about upcoming discharge. These included fears of upcoming changes, lack of change, new living environments, judgements and expectations from others and overall stigma of mental health issues. The group then discussed the definition of a supportive framework, what that looks and feels like, and how do to discern it from an unhealthy non-supportive network. The group identified different types of supports as well as what to do when your family/friends are less than helpful or unavailable  Therapeutic Goals  1. Patient will identify one healthy supportive network that they can use at discharge. 2. Patient will identify one factor of a supportive framework and how to tell it from an unhealthy network. 3. Patient able to identify one coping skill to use when they do not have positive supports from others. 4. Patient will demonstrate ability to communicate their needs through discussion and/or role plays.  Summary of Patient Progress:  Pt was invited to group but did not attend.    Therapeutic Modalities Cognitive Behavioral Therapy Motivational Interviewing   Lee Kuang  CUEBAS-COLON, LCSW 11/26/2018 9:33 AM  

## 2018-11-26 NOTE — Progress Notes (Signed)
[facility] MD Progress Note  11/26/2018 9:34 AM Ashley Griffin  MRN:  [id number] Subjective:    38yo F who presents in [facility] inpatient psychiatric unit due to psychosis. Chart reviewed, patient seen  Patient appears brighter today, compare to her presentation yesterday. She is visibly better groomed. She reports "feeling much better. I think the new medication is working for me". She reports she slept "really well" last night. She reports "good" mood, denies suicidal or homicidal thoughts; "I just miss my family".  She says "nothing scared me last night". No medication side effects reported. No current physical complaints.    Principal Problem: Brief reactive psychosis (HCC) Diagnosis: Principal Problem:   Brief reactive psychosis (HCC) Active Problems:   Depression   Anxiety state  Total Time spent with patient: 20 minutes  Past Psychiatric History: see H&P  Past Medical History: History reviewed. No pertinent past medical history. History reviewed. No pertinent surgical history. Family History: History reviewed. No pertinent family history. Family Psychiatric  History: see H&P Social History:  Social History   Substance and Sexual Activity  Alcohol Use Not Currently  . Frequency: Never     Social History   Substance and Sexual Activity  Drug Use Not on file    Social History   Socioeconomic History  . Marital status: Married    Spouse name: Not on file  . Number of children: Not on file  . Years of education: Not on file  . Highest education level: Not on file  Occupational History  . Not on file  Social Needs  . Financial resource strain: Not on file  . Food insecurity:    Worry: Not on file    Inability: Not on file  . Transportation needs:    Medical: Not on file    Non-medical: Not on file  Tobacco Use  . Smoking status: Never Smoker  . Smokeless tobacco: Never Used  Substance and Sexual Activity  . Alcohol use: Not Currently    Frequency: Never  . Drug  use: Not on file  . Sexual activity: Not on file  Lifestyle  . Physical activity:    Days per week: Not on file    Minutes per session: Not on file  . Stress: Not on file  Relationships  . Social connections:    Talks on phone: Not on file    Gets together: Not on file    Attends religious service: Not on file    Active member of club or organization: Not on file    Attends meetings of clubs or organizations: Not on file    Relationship status: Not on file  Other Topics Concern  . Not on file  Social History Narrative  . Not on file   Additional Social History:                         Sleep: Fair  Appetite:  Good  Current Medications: Current Facility-Administered Medications  Medication Dose Route Frequency Provider Last Rate Last Dose  . acetaminophen (TYLENOL) tablet 650 mg  650 mg Oral Q6H PRN Catalina Gravel, NP   650 mg at 11/24/18 2226  . alum & mag hydroxide-simeth (MAALOX/MYLANTA) 200-200-20 MG/5ML suspension 30 mL  30 mL Oral Q4H PRN Thomspon, Adela Lank, NP      . feeding supplement (ENSURE ENLIVE) (ENSURE ENLIVE) liquid 237 mL  237 mL Oral BID BM Clapacs, John T, MD   237 mL at 11/25/18 1404  .  hydrOXYzine (ATARAX/VISTARIL) tablet 25 mg  25 mg Oral Q6H PRN Catalina Gravel, NP   25 mg at 11/20/18 0223  . ibuprofen (ADVIL,MOTRIN) tablet 400 mg  400 mg Oral Q6H PRN Catalina Gravel, NP   400 mg at 11/18/18 0755  . lisinopril (PRINIVIL,ZESTRIL) tablet 20 mg  20 mg Oral Daily Clapacs, Jackquline Denmark, MD   20 mg at 11/26/18 0841  . loratadine (CLARITIN) tablet 10 mg  10 mg Oral Daily Catalina Gravel, NP   10 mg at 11/26/18 0841  . LORazepam (ATIVAN) tablet 1 mg  1 mg Oral Q4H PRN He, Jun, MD   1 mg at 11/25/18 0820  . magnesium hydroxide (MILK OF MAGNESIA) suspension 30 mL  30 mL Oral Daily PRN Catalina Gravel, NP      . multivitamin with minerals tablet 1 tablet  1 tablet Oral Daily Clapacs, Jackquline Denmark, MD   1 tablet at 11/26/18 0841  . OLANZapine  zydis (ZYPREXA) disintegrating tablet 20 mg  20 mg Oral QHS Clapacs, Jackquline Denmark, MD   20 mg at 11/25/18 2119  . risperiDONE (RISPERDAL M-TABS) disintegrating tablet 1 mg  1 mg Oral Q6H PRN Clapacs, Jackquline Denmark, MD   1 mg at 11/25/18 2080  . sertraline (ZOLOFT) tablet 100 mg  100 mg Oral QHS Clapacs, John T, MD   100 mg at 11/25/18 2120  . traZODone (DESYREL) tablet 50 mg  50 mg Oral QHS Catalina Gravel, NP   Stopped at 11/26/18 0730    Lab Results: No results found for this or any previous visit (from the past 48 hour(s)).  Blood Alcohol level:  Lab Results  Component Value Date   ETH <10 11/09/2018    Metabolic Disorder Labs: No results found for: HGBA1C, MPG No results found for: PROLACTIN No results found for: CHOL, TRIG, HDL, CHOLHDL, VLDL, LDLCALC  Physical Findings: AIMS: Facial and Oral Movements Muscles of Facial Expression: None, normal Lips and Perioral Area: None, normal Jaw: None, normal Tongue: None, normal,Extremity Movements Upper (arms, wrists, hands, fingers): None, normal Lower (legs, knees, ankles, toes): None, normal, Trunk Movements Neck, shoulders, hips: None, normal, Overall Severity Severity of abnormal movements (highest score from questions above): None, normal Incapacitation due to abnormal movements: None, normal Patient's awareness of abnormal movements (rate only patient's report): No Awareness, Dental Status Current problems with teeth and/or dentures?: No Does patient usually wear dentures?: No  CIWA:    COWS:  COWS Total Score: 6  Musculoskeletal: Strength & Muscle Tone: within normal limits Gait & Station: normal Patient leans: N/A  Psychiatric Specialty Exam: Physical Exam  ROS  Blood pressure 122/82, pulse (!) 101, temperature 97.9 F (36.6 C), temperature source Oral, resp. rate 16, height 5\' 1"  (1.549 m), weight 68.9 kg, last menstrual period 11/09/2018, SpO2 98 %.Body mass index is 28.72 kg/m.  General Appearance: Casual and Well  Groomed  Eye Contact:  Good  Speech:  Normal Rate  Volume:  Normal  Mood:  Euthymic  Affect:  Constricted  Thought Process:  Coherent and Goal Directed  Orientation:  Full (Time, Place, and Person)  Thought Content:  Logical  Suicidal Thoughts:  No  Homicidal Thoughts:  No  Memory:  Immediate;   Fair Recent;   Poor Remote;   Fair  Judgement:  Fair  Insight:  questionable  Psychomotor Activity:  Normal  Concentration:  Concentration: Fair and Attention Span: Fair  Recall:  [employer] of Knowledge:  Fair  Language:  Good  Akathisia:  No  Handed:  Right  AIMS (if indicated):     Assets:  Desire for Improvement Housing Physical Health  ADL's:  Intact  Cognition:  WNL  Sleep:  Number of Hours: 6.5     Treatment Plan Summary: Daily contact with patient to assess and evaluate symptoms and progress in treatment   38yo F who presents in [facility] inpatient psychiatric unit due to psychosis.  Patient appears to improve today compare to her presentation yesterday. She is able to participate in meaningful conversation, she looks brighter, she takes care of her appearance. She reports good night sleep, she does not express any delusions and states that "nothing scares" her today. She insight remains questionable, although judgement appears to be improving. Will continue current medication regimen.  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  Plan: -continue inpatient psych admission; 15-minute checks; daily contact with patient to assess and evaluate symptoms and progress in treatment.   -continue Zypreza  PO QHS for psychosis, Zoloft  PO QHS for depression, Trazodone  PO QHS for sleep.  -consider ECT if no improvement on medications.    -psychoeducation;    -Disposition: to be determined.  Thalia Party, MD 11/26/2018, 9:34 AM

## 2018-11-26 NOTE — Progress Notes (Signed)
D: D: Patient stated slept good last night .Stated appetite is good and energy level   normal. Stated concentration good . Stated on Depression scale 3 , hopeless 0 and anxiety 7 .( low 0-10 high) Denies suicidal  homicidal ideations  . Denies auditory hallucinations  No pain concerns . Appropriate ADL'S. Interacting with peers and staff. Patient  is knowledgeable of information received , able to verbalize understanding . Emotional and  mental status improved . Able to  verbalize no safety concerns . Thought process improving . Compliant  with medication given.  .  Patient isolates to room  limiting socialization with peers . Attending unit  programing , able to vent positive  feelings  of self ,working on coping  skills .Marland Kitchen   Denies any sleeping  issues.  Patient continue to pace the hall . Periods of time  During shift , noted  Tearful. . Able to talk to husband  Today.  Continue to voice of wanting to go home .  No comment on ECT treatment .    A: Encourage patient participation with unit programming . Instruction  Given on  Medication , verbalize understanding.  R: Voice no other concerns. Staff continue to monitor

## 2018-11-27 NOTE — Plan of Care (Signed)
Patient is showing  inconsistent treatment response  as of today she has been confused and demanding to sign her self out of here, and intermittent crying spells and generalized anxiety, showing frustrations and feeling of fatigue, patient contract for safety and at the same time denies any SI/HI/AVH, education is provided, support provided,  encourage coping skills to improve mental and emotional state, medication is taking, no side effects, Problem: Education: Goal: Utilization of techniques to improve thought processes will improve Outcome: Progressing Goal: Knowledge of the prescribed therapeutic regimen will improve Outcome: Progressing   Problem: Coping: Goal: Coping ability will improve Outcome: Progressing Goal: Will verbalize feelings Outcome: Progressing   Problem: Health Behavior/Discharge Planning: Goal: Identification of resources available to assist in meeting health care needs will improve Outcome: Progressing   Problem: Self-Concept: Goal: Will verbalize positive feelings about self Outcome: Progressing   Problem: Coping: Goal: Coping ability will improve Outcome: Progressing   Problem: Education: Goal: Knowledge of [facility] Education information/materials will improve Outcome: Progressing Goal: Emotional status will improve Outcome: Progressing Goal: Mental status will improve Outcome: Progressing Goal: Verbalization of understanding the information provided will improve Outcome: Progressing   Problem: Activity: Goal: Interest or engagement in activities will improve Outcome: Progressing Goal: Sleeping patterns will improve Outcome: Progressing   Problem: Coping: Goal: Ability to verbalize frustrations and anger appropriately will improve Outcome: Progressing Goal: Ability to demonstrate self-control will improve Outcome: Progressing   Problem: Health Behavior/Discharge Planning: Goal: Identification of resources available to assist in  meeting health care needs will improve Outcome: Progressing Goal: Compliance with treatment plan for underlying cause of condition will improve Outcome: Progressing   Problem: Physical Regulation: Goal: Ability to maintain clinical measurements within normal limits will improve Outcome: Progressing   Problem: Safety: Goal: Periods of time without injury will increase Outcome: Progressing   Problem: Activity: Goal: Will verbalize the importance of balancing activity with adequate rest periods Outcome: Progressing   Problem: Education: Goal: Will be free of psychotic symptoms Outcome: Progressing Goal: Knowledge of the prescribed therapeutic regimen will improve Outcome: Progressing   Problem: Coping: Goal: Coping ability will improve Outcome: Progressing Goal: Will verbalize feelings Outcome: Progressing   Problem: Health Behavior/Discharge Planning: Goal: Compliance with prescribed medication regimen will improve Outcome: Progressing   Problem: Nutritional: Goal: Ability to achieve adequate nutritional intake will improve Outcome: Progressing   Problem: Role Relationship: Goal: Ability to communicate needs accurately will improve Outcome: Progressing Goal: Ability to interact with others will improve Outcome: Progressing   Problem: Safety: Goal: Ability to redirect hostility and anger into socially appropriate behaviors will improve Outcome: Progressing Goal: Ability to remain free from injury will improve Outcome: Progressing   Problem: Self-Care: Goal: Ability to participate in self-care as condition permits will improve Outcome: Progressing   Problem: Self-Concept: Goal: Will verbalize positive feelings about self Outcome: Progressing   sleep is continuous and appetite is good , monitored every 15 minutes for safety, no distress noted.

## 2018-11-27 NOTE — Progress Notes (Signed)
Recreation Therapy Notes  Date: 11/27/2018  Time: 9:30 am   Location: Craft room   Behavioral response: N/A   Intervention Topic: Self-esteem  Discussion/Intervention: Patient did not attend group.   Clinical Observations/Feedback:  Patient did not attend group.   Laporsha Grealish LRT/CTRS        Armari Fussell 11/27/2018 10:48 AM

## 2018-11-27 NOTE — Progress Notes (Signed)
D: Patient stated slept good last night .Stated appetite  good and energy level low. Stated concentration poor . Stated on Depression scale 2 , hopeless 4 and anxiety 3 .( low 0-10 high) Denies suicidal  homicidal ideations  .  No auditory hallucinations  No pain concerns . Appropriate ADL'S. Interacting with peers and staff. Denies any sleeping  issues  Patient is knowledgeable of information received , able to verbalize understanding . Emotional and mental status improved . Able to verbalize no safety concerns . Thought process improving . Compliant with medication given. . Denies pain issues . Patient isolates to room limiting socialization with peers . Attending unit programing , able to vent positive feelings of self working on coping skills . Patient denies suicidal ideation. Patient noted paranoid, and disorganized.  .  A: Encourage patient participation with unit programming . Instruction  Given on  Medication , verbalize understanding.  R: Voice no other concerns. Staff continue to monitor

## 2018-11-27 NOTE — Progress Notes (Signed)
D: Patient has been increasingly paranoid. Stated she would not take her medication from this author. Stated "I don't trust you--you're not [place] Deakin". Reassured her that I was the same staff member who had been caring for her for a couple of weeks. Affect and mood are anxious. Guarded. Took her medication reluctantly from the other RN. Stated "that medication is killing me. You're going to kill me". Denies SI and HI. A: Continue to monitor for safety and offer support. R: Safety maintained. 

## 2018-11-27 NOTE — Plan of Care (Signed)
Denies any sleeping  issues Patient  is knowledgeable of information received , able to verbalize understanding . Emotional and  mental status improved . Able to  verbalize no safety concerns . Thought process improving . Compliant  with medication given.  . Denies pain issues . Patient isolates to room  limiting socialization with peers . Attending unit  programing , able to vent positive  feelings  of self working on coping  skills . Patient denies suicidal ideation.   .   Problem: Education: Goal: Utilization of techniques to improve thought processes will improve Outcome: Progressing Goal: Knowledge of the prescribed therapeutic regimen will improve Outcome: Progressing   Problem: Coping: Goal: Coping ability will improve Outcome: Progressing Goal: Will verbalize feelings Outcome: Progressing   Problem: Health Behavior/Discharge Planning: Goal: Identification of resources available to assist in meeting health care needs will improve Outcome: Progressing   Problem: Self-Concept: Goal: Will verbalize positive feelings about self Outcome: Progressing   Problem: Coping: Goal: Coping ability will improve Outcome: Progressing   Problem: Education: Goal: Knowledge of [facility] Education information/materials will improve Outcome: Progressing Goal: Emotional status will improve Outcome: Progressing Goal: Mental status will improve Outcome: Progressing Goal: Verbalization of understanding the information provided will improve Outcome: Progressing   Problem: Activity: Goal: Interest or engagement in activities will improve Outcome: Progressing Goal: Sleeping patterns will improve Outcome: Progressing   Problem: Coping: Goal: Ability to verbalize frustrations and anger appropriately will improve Outcome: Progressing Goal: Ability to demonstrate self-control will improve Outcome: Progressing   Problem: Health Behavior/Discharge Planning: Goal: Identification of  resources available to assist in meeting health care needs will improve Outcome: Progressing Goal: Compliance with treatment plan for underlying cause of condition will improve Outcome: Progressing   Problem: Physical Regulation: Goal: Ability to maintain clinical measurements within normal limits will improve Outcome: Progressing   Problem: Safety: Goal: Periods of time without injury will increase Outcome: Progressing   Problem: Activity: Goal: Will verbalize the importance of balancing activity with adequate rest periods Outcome: Progressing   Problem: Education: Goal: Will be free of psychotic symptoms Outcome: Progressing Goal: Knowledge of the prescribed therapeutic regimen will improve Outcome: Progressing   Problem: Coping: Goal: Coping ability will improve Outcome: Progressing Goal: Will verbalize feelings Outcome: Progressing   Problem: Health Behavior/Discharge Planning: Goal: Compliance with prescribed medication regimen will improve Outcome: Progressing   Problem: Nutritional: Goal: Ability to achieve adequate nutritional intake will improve Outcome: Progressing   Problem: Role Relationship: Goal: Ability to communicate needs accurately will improve Outcome: Progressing Goal: Ability to interact with others will improve Outcome: Progressing   Problem: Safety: Goal: Ability to redirect hostility and anger into socially appropriate behaviors will improve Outcome: Progressing Goal: Ability to remain free from injury will improve Outcome: Progressing   Problem: Self-Care: Goal: Ability to participate in self-care as condition permits will improve Outcome: Progressing   Problem: Self-Concept: Goal: Will verbalize positive feelings about self Outcome: Progressing

## 2018-11-27 NOTE — Progress Notes (Signed)
[facility]  11/27/2018 4:50 PM Ashley Griffin  MRN:  [id number] Subjective: Patient seen.  Chart reviewed.  Patient remains psychotic and confused.  She was tearful today.  Very poor self-care.  When she spoke with me she was clearly paranoid.  She told me that "I do not think of myself as God" and this was apropos of absolutely nothing in the conversation.  Later came back to talk to me to "apologized" for nothing in particular.  A lot of what she says does not hold together very clearly.  Cries a lot.  She denied being "depressed" but admitted to being frightened.  Tolerating medicine as far as I can tell although she continues to feel that the medicine is what is causing her symptoms rather than the other way around. Principal Problem: Brief reactive psychosis (HCC) Diagnosis: Principal Problem:   Brief reactive psychosis (HCC) Active Problems:   Depression   Anxiety state  Total Time spent with patient: 30 minutes  Past Psychiatric History: Patient has no significant past psychiatric history  Past Medical History: History reviewed. No pertinent past medical history. History reviewed. No pertinent surgical history. Family History: History reviewed. No pertinent family history. Family Psychiatric  History: None known Social History:  Social History   Substance and Sexual Activity  Alcohol Use Not Currently  . Frequency: Never     Social History   Substance and Sexual Activity  Drug Use Not on file    Social History   Socioeconomic History  . Marital status: Married    Spouse name: Not on file  . Number of children: Not on file  . Years of education: Not on file  . Highest education level: Not on file  Occupational History  . Not on file  Social Needs  . Financial resource strain: Not on file  . Food insecurity:    Worry: Not on file    Inability: Not on file  . Transportation needs:    Medical: Not on file    Non-medical: Not on file  Tobacco Use  . Smoking  status: Never Smoker  . Smokeless tobacco: Never Used  Substance and Sexual Activity  . Alcohol use: Not Currently    Frequency: Never  . Drug use: Not on file  . Sexual activity: Not on file  Lifestyle  . Physical activity:    Days per week: Not on file    Minutes per session: Not on file  . Stress: Not on file  Relationships  . Social connections:    Talks on phone: Not on file    Gets together: Not on file    Attends religious service: Not on file    Active member of club or organization: Not on file    Attends meetings of clubs or organizations: Not on file    Relationship status: Not on file  Other Topics Concern  . Not on file  Social History Narrative  . Not on file   Additional Social History:                         Sleep: Fair  Appetite:  Fair  Current Medications: Current Facility-Administered Medications  Medication Dose Route Frequency Provider Last Rate Last Dose  . acetaminophen (TYLENOL) tablet 650 mg  650 mg Oral Q6H PRN Catalina Gravelhomspon, Jacqueline, NP   650 mg at 11/24/18 2226  . alum & mag hydroxide-simeth (MAALOX/MYLANTA) 200-200-20 MG/5ML suspension 30 mL  30 mL Oral  Q4H PRN Catalina Gravel, NP      . feeding supplement (ENSURE ENLIVE) (ENSURE ENLIVE) liquid 237 mL  237 mL Oral BID BM Clapacs, John T, MD   237 mL at 11/27/18 1035  . hydrOXYzine (ATARAX/VISTARIL) tablet 25 mg  25 mg Oral Q6H PRN Catalina Gravel, NP   25 mg at 11/20/18 0223  . ibuprofen (ADVIL,MOTRIN) tablet 400 mg  400 mg Oral Q6H PRN Catalina Gravel, NP   400 mg at 11/18/18 0755  . lisinopril (PRINIVIL,ZESTRIL) tablet 20 mg  20 mg Oral Daily Clapacs, Jackquline Denmark, MD   20 mg at 11/27/18 0751  . loratadine (CLARITIN) tablet 10 mg  10 mg Oral Daily Catalina Gravel, NP   10 mg at 11/27/18 0751  . LORazepam (ATIVAN) tablet 1 mg  1 mg Oral Q4H PRN He, Jun, MD   1 mg at 11/25/18 0820  . magnesium hydroxide (MILK OF MAGNESIA) suspension 30 mL  30 mL Oral Daily PRN Catalina Gravel, NP      . multivitamin with minerals tablet 1 tablet  1 tablet Oral Daily Clapacs, Jackquline Denmark, MD   1 tablet at 11/27/18 0752  . OLANZapine zydis (ZYPREXA) disintegrating tablet 20 mg  20 mg Oral QHS Clapacs, Jackquline Denmark, MD   20 mg at 11/26/18 2117  . risperiDONE (RISPERDAL M-TABS) disintegrating tablet 1 mg  1 mg Oral Q6H PRN Clapacs, Jackquline Denmark, MD   1 mg at 11/25/18 0947  . sertraline (ZOLOFT) tablet 100 mg  100 mg Oral QHS Clapacs, Jackquline Denmark, MD   100 mg at 11/26/18 2114  . traZODone (DESYREL) tablet 50 mg  50 mg Oral QHS Catalina Gravel, NP   50 mg at 11/26/18 2117    Lab Results: No results found for this or any previous visit (from the past 48 hour(s)).  Blood Alcohol level:  Lab Results  Component Value Date   ETH <10 11/09/2018    Metabolic Disorder Labs: No results found for: HGBA1C, MPG No results found for: PROLACTIN No results found for: CHOL, TRIG, HDL, CHOLHDL, VLDL, LDLCALC  Physical Findings: AIMS: Facial and Oral Movements Muscles of Facial Expression: None, normal Lips and Perioral Area: None, normal Jaw: None, normal Tongue: None, normal,Extremity Movements Upper (arms, wrists, hands, fingers): None, normal Lower (legs, knees, ankles, toes): None, normal, Trunk Movements Neck, shoulders, hips: None, normal, Overall Severity Severity of abnormal movements (highest score from questions above): None, normal Incapacitation due to abnormal movements: None, normal Patient's awareness of abnormal movements (rate only patient's report): No Awareness, Dental Status Current problems with teeth and/or dentures?: No Does patient usually wear dentures?: No  CIWA:    COWS:  COWS Total Score: 6  Musculoskeletal: Strength & Muscle Tone: within normal limits Gait & Station: normal Patient leans: N/A  Psychiatric Specialty Exam: Physical Exam  Nursing note and vitals reviewed. Constitutional: She appears well-developed and well-nourished.  HENT:  Head:  Normocephalic and atraumatic.  Eyes: Pupils are equal, round, and reactive to light. Conjunctivae are normal.  Neck: Normal range of motion.  Cardiovascular: Regular rhythm and normal heart sounds.  Respiratory: Effort normal.  GI: Soft.  Musculoskeletal: Normal range of motion.  Neurological: She is alert.  Skin: Skin is warm and dry.  Psychiatric: Her affect is blunt and labile. Her speech is tangential. She is slowed and withdrawn. Thought content is paranoid and delusional. Cognition and memory are impaired. She expresses inappropriate judgment. She exhibits a depressed mood.    Review of Systems  Constitutional: Negative.   HENT: Negative.   Eyes: Negative.   Respiratory: Negative.   Cardiovascular: Negative.   Gastrointestinal: Negative.   Musculoskeletal: Negative.   Skin: Negative.   Neurological: Negative.   Psychiatric/Behavioral: Positive for hallucinations and memory loss. Negative for depression, substance abuse and suicidal ideas. The patient is nervous/anxious. The patient does not have insomnia.     Blood pressure 131/79, pulse 92, temperature 98.8 F (37.1 C), temperature source Oral, resp. rate 17, height 5\' 1"  (1.549 m), weight 68.9 kg, last menstrual period 11/09/2018, SpO2 99 %.Body mass index is 28.72 kg/m.  General Appearance: Disheveled  Eye Contact:  Fair  Speech:  Garbled and Slow  Volume:  Decreased  Mood:  Dysphoric  Affect:  Labile and Tearful  Thought Process:  Disorganized  Orientation:  Negative  Thought Content:  Illogical and Hallucinations: Auditory  Suicidal Thoughts:  No  Homicidal Thoughts:  No  Memory:  Immediate;   Fair Recent;   Poor Remote;   Poor  Judgement:  Impaired  Insight:  Shallow  Psychomotor Activity:  Decreased  Concentration:  Concentration: Poor  Recall:  Poor  Fund of Knowledge:  Fair  Language:  Fair  Akathisia:  No  Handed:  Right  AIMS (if indicated):     Assets:  Desire for Improvement Housing Social  Support  ADL's:  Impaired  Cognition:  Impaired,  Mild  Sleep:  Number of Hours: 6.5     Treatment Plan Summary: Daily contact with patient to assess and evaluate symptoms and progress in treatment, Medication management and Plan Patient is on robust doses of antidepressant and antipsychotic and shows a little bit of improvement but still is extremely impaired with psychotic symptoms poor self-care.  I have suggested to her repeatedly that electroconvulsive therapy would be in my opinion the best option for her.  Patient seemed to be at least open to considering treatment today and said she would like to talk with her husband.  I will also call him today and speak with him about my proposal for the treatment plan.  Otherwise no change to medicine today.  Mordecai Rasmussen, MD 11/27/2018, 4:50 PM

## 2018-11-27 NOTE — Plan of Care (Signed)
D: Patient has been increasingly paranoid. Stated she would not take her medication from this author. Stated "I don't trust you--you're not Britta Mccreedy". Reassured her that I was the same staff member who had been caring for her for a couple of weeks. Affect and mood are anxious. Guarded. Took her medication reluctantly from the other RN. Stated "that medication is killing me. You're going to kill me". Denies SI and HI. A: Continue to monitor for safety and offer support. R: Safety maintained.

## 2018-11-27 NOTE — [facility] Group Notes (Signed)
Overcoming Obstacles  11/27/2018 1PM  Type of Therapy and Topic:  Group Therapy:  Overcoming Obstacles  Participation Level:  Did Not Attend    Description of Group:    In this group patients will be encouraged to explore what they see as obstacles to their own wellness and recovery. They will be guided to discuss their thoughts, feelings, and behaviors related to these obstacles. The group will process together ways to cope with barriers, with attention given to specific choices patients can make. Each patient will be challenged to identify changes they are motivated to make in order to overcome their obstacles. This group will be process-oriented, with patients participating in exploration of their own experiences as well as giving and receiving support and challenge from other group members.   Therapeutic Goals: 1. Patient will identify personal and current obstacles as they relate to admission. 2. Patient will identify barriers that currently interfere with their wellness or overcoming obstacles.  3. Patient will identify feelings, thought process and behaviors related to these barriers. 4. Patient will identify two changes they are willing to make to overcome these obstacles:      Summary of Patient Progress     Therapeutic Modalities:   Cognitive Behavioral Therapy Solution Focused Therapy Motivational Interviewing Relapse Prevention Therapy    Lowella Dandy, MSW, LCSW 11/27/2018 1:57 PM

## 2018-11-28 NOTE — [facility] Group Notes (Signed)
  LCSW Group Therapy Note  11/28/2018 11:43 AM   Type of Therapy/Topic:  Group Therapy:  Feelings about Diagnosis  Participation Level:  Minimal   Description of Group:   This group will allow patients to explore their thoughts and feelings about diagnoses they have received. Patients will be guided to explore their level of understanding and acceptance of these diagnoses. Facilitator will encourage patients to process their thoughts and feelings about the reactions of others to their diagnosis and will guide patients in identifying ways to discuss their diagnosis with significant others in their lives. This group will be process-oriented, with patients participating in exploration of their own experiences, giving and receiving support, and processing challenge from other group members.   Therapeutic Goals: 1. Patient will demonstrate understanding of diagnosis as evidenced by identifying two or more symptoms of the disorder 2. Patient will be able to express two feelings regarding the diagnosis 3. Patient will demonstrate their ability to communicate their needs through discussion and/or role play  Summary of Patient Progress: Pt was present in group, but did not participate much to the discussion. Pt did report that a medical doctor is the only one who could diagnose a person with any diagnosis. Pt was calm and thanked CSW at the end of group. Pt was pleasant and appeared to be following the discussion.    Therapeutic Modalities:   Cognitive Behavioral Therapy Brief Therapy Feelings Identification    Iris Pert, MSW, LCSW Clinical Social Work 11/28/2018 11:43 AM

## 2018-11-28 NOTE — [facility] Group Notes (Signed)
[facility] Group Notes:  (Nursing/MHT/Case Management/Adjunct)  Date:  11/28/2018  Time:  10:37 PM  Type of Therapy:  Group Therapy  Participation Level:  Minimal  Participation Quality:  Appropriate  Affect:  Appropriate  Cognitive:  Alert  Insight:  Good  Engagement in Group:  Engaged  Modes of Intervention:  Support  Summary of Progress/Problems:  Ashley Griffin 11/28/2018, 10:37 PM

## 2018-11-28 NOTE — Progress Notes (Signed)
D: Continue to ask  About going home . Paranoid  With unit staff and peers .Supicious Behavior  vigilante  With surroundings Patient  Not fully understanding  Situations presented.  A: Patient  Remains altered thought R: Staff will continue to monitor  Patient behavior

## 2018-11-28 NOTE — Progress Notes (Signed)
.  D: Patient stated slept good last night .Stated appetite is good and energy level  Is normal. Stated concentration poor . Stated on Depression scale 3 , hopeless 0 and anxiety 7 .( low 0-10 high) Denies suicidal  homicidal ideations  .Responding  To enteral  Stimuli   No pain concerns . Limited  Interacting with peers and staff. Patient angry and agitated this am shift . Patient  Continue to voice of nerve gas coming through the vents  Of the hospital . Paranoid and delusional . Fearful of people coming into her room . Requesting  Staff to watch her door while she showered . Patient never did shower . Staff assisted face time with patient husband  Fredrik Cove ) Patient voice during the entire time of phone conversation this was a recorded  Conversation . Patient  Continue to voice of wanting to go home .   A: Encourage patient participation with unit programming . Instruction  Given on  Medication , verbalize understanding.  R: Voice no other concerns. Staff continue to monitor  .

## 2018-11-28 NOTE — Progress Notes (Signed)
Recreation Therapy Notes   Date: 11/28/2018  Time: 9:30 am  Location: Craft Room  Behavioral response: Appropriate  Intervention Topic: Stress  Discussion/Intervention:  Group content on today was focused on stress. The group defined stress and way to cope with stress. Participants expressed how they know when they are stresses out. Individuals described the different ways they have to cope with stress. The group stated reasons why it is important to cope with stress. Patient explained what good stress is and some examples. The group participated in the intervention "Stress Management Jeopardy". Individuals were separated into two group and answered questions related to stress.  Clinical Observations/Feedback:  Patient came to group and was focused on what peers and staff had to say about self-esteem. She explained that people normally does not know she is stressed unless she tells them. Participant stated it is important to focus on the important things when trying to manage stress. Individual left group early due to unknown reasons and never returned.  Neziah Braley LRT/CTRS         Kayde Warehime 11/28/2018 11:10 AM

## 2018-11-28 NOTE — Plan of Care (Signed)
Denies any sleeping  issues Patient  is knowledgeable of information received , able to verbalize understanding . Emotional and  mental status improved . Able to  verbalize no safety concerns . Thought process improving . Compliant  with medication given.  . Denies pain issues . Patient isolates to room  limiting socialization with peers . Attending unit  programing , able to vent positive  feelings  of self working on coping  skills . Patient denies suicidal ideation.   .     Problem: Education: Goal: Utilization of techniques to improve thought processes will improve Outcome: Progressing Goal: Knowledge of the prescribed therapeutic regimen will improve Outcome: Progressing   Problem: Coping: Goal: Coping ability will improve Outcome: Progressing Goal: Will verbalize feelings Outcome: Progressing   Problem: Health Behavior/Discharge Planning: Goal: Identification of resources available to assist in meeting health care needs will improve Outcome: Progressing   Problem: Self-Concept: Goal: Will verbalize positive feelings about self Outcome: Progressing   Problem: Coping: Goal: Coping ability will improve Outcome: Progressing   Problem: Education: Goal: Knowledge of [facility] Education information/materials will improve Outcome: Progressing Goal: Emotional status will improve Outcome: Progressing Goal: Mental status will improve Outcome: Progressing Goal: Verbalization of understanding the information provided will improve Outcome: Progressing   Problem: Activity: Goal: Interest or engagement in activities will improve Outcome: Progressing Goal: Sleeping patterns will improve Outcome: Progressing   Problem: Coping: Goal: Ability to verbalize frustrations and anger appropriately will improve Outcome: Progressing Goal: Ability to demonstrate self-control will improve Outcome: Progressing   Problem: Health Behavior/Discharge Planning: Goal: Identification of  resources available to assist in meeting health care needs will improve Outcome: Progressing Goal: Compliance with treatment plan for underlying cause of condition will improve Outcome: Progressing   Problem: Physical Regulation: Goal: Ability to maintain clinical measurements within normal limits will improve Outcome: Progressing   Problem: Safety: Goal: Periods of time without injury will increase Outcome: Progressing   Problem: Activity: Goal: Will verbalize the importance of balancing activity with adequate rest periods Outcome: Progressing   Problem: Education: Goal: Will be free of psychotic symptoms Outcome: Progressing Goal: Knowledge of the prescribed therapeutic regimen will improve Outcome: Progressing   Problem: Coping: Goal: Coping ability will improve Outcome: Progressing Goal: Will verbalize feelings Outcome: Progressing   Problem: Health Behavior/Discharge Planning: Goal: Compliance with prescribed medication regimen will improve Outcome: Progressing   Problem: Nutritional: Goal: Ability to achieve adequate nutritional intake will improve Outcome: Progressing   Problem: Role Relationship: Goal: Ability to communicate needs accurately will improve Outcome: Progressing Goal: Ability to interact with others will improve Outcome: Progressing   Problem: Safety: Goal: Ability to redirect hostility and anger into socially appropriate behaviors will improve Outcome: Progressing Goal: Ability to remain free from injury will improve Outcome: Progressing   Problem: Self-Care: Goal: Ability to participate in self-care as condition permits will improve Outcome: Progressing   Problem: Self-Concept: Goal: Will verbalize positive feelings about self Outcome: Progressing

## 2018-11-28 NOTE — Progress Notes (Signed)
[facility] MD Progress Note  11/28/2018 10:02 AM Ashley Griffin  MRN:  [id number] Subjective:    37yo F who presents in [facility] inpatient psychiatric unit due to psychosis. Chart reviewed, case discussed with treatment team, patient seen.  Patient is calm, she is compliant with medications. She is still delusional "people are talking about things happen here at night" - she was not able to elaborate further. She reports feeling "better", when she asked what is better, she says that her memory is better. She says she remembers that she was admitted after she had "a breakdown and husband had to call 911". She says "I think the medication is working for me" at the same time she is concerned that she is having medication side effects, but cannot list any symptoms; she demonstrates her forearms (without any visible rush or other lesions). She shows her red "allergy alert" band and says "you should know all my allergies. She was reassured that we do not give her medications she is officially allergic to. She remembers that Dr Toni Amend discussed the possibility of starting ECT with her. She says she needs to discuss with her family and that she physically has to be at home to discuss that. She encouraged to do that over the phone. She denies suicidal or homicidal thoughts. No current physical complaints.    Principal Problem: Brief reactive psychosis (HCC) Diagnosis: Principal Problem:   Brief reactive psychosis (HCC) Active Problems:   Depression   Anxiety state  Total Time spent with patient: 20 minutes  Past Psychiatric History: see H&P  Past Medical History: History reviewed. No pertinent past medical history. History reviewed. No pertinent surgical history. Family History: History reviewed. No pertinent family history. Family Psychiatric  History: see H&P Social History:  Social History   Substance and Sexual Activity  Alcohol Use Not Currently  . Frequency: Never     Social History   Substance  and Sexual Activity  Drug Use Not on file    Social History   Socioeconomic History  . Marital status: Married    Spouse name: Not on file  . Number of children: Not on file  . Years of education: Not on file  . Highest education level: Not on file  Occupational History  . Not on file  Social Needs  . Financial resource strain: Not on file  . Food insecurity:    Worry: Not on file    Inability: Not on file  . Transportation needs:    Medical: Not on file    Non-medical: Not on file  Tobacco Use  . Smoking status: Never Smoker  . Smokeless tobacco: Never Used  Substance and Sexual Activity  . Alcohol use: Not Currently    Frequency: Never  . Drug use: Not on file  . Sexual activity: Not on file  Lifestyle  . Physical activity:    Days per week: Not on file    Minutes per session: Not on file  . Stress: Not on file  Relationships  . Social connections:    Talks on phone: Not on file    Gets together: Not on file    Attends religious service: Not on file    Active member of club or organization: Not on file    Attends meetings of clubs or organizations: Not on file    Relationship status: Not on file  Other Topics Concern  . Not on file  Social History Narrative  . Not on file   Additional  Social History:                         Sleep: Fair  Appetite:  Good  Current Medications: Current Facility-Administered Medications  Medication Dose Route Frequency Provider Last Rate Last Dose  . acetaminophen (TYLENOL) tablet 650 mg  650 mg Oral Q6H PRN Catalina Gravelhomspon, Jacqueline, NP   650 mg at 11/24/18 2226  . alum & mag hydroxide-simeth (MAALOX/MYLANTA) 200-200-20 MG/5ML suspension 30 mL  30 mL Oral Q4H PRN Thomspon, Adela LankJacqueline, NP      . feeding supplement (ENSURE ENLIVE) (ENSURE ENLIVE) liquid 237 mL  237 mL Oral BID BM Clapacs, John T, MD   237 mL at 11/27/18 1413  . hydrOXYzine (ATARAX/VISTARIL) tablet 25 mg  25 mg Oral Q6H PRN Catalina Gravelhomspon, Jacqueline, NP   25 mg  at 11/20/18 0223  . ibuprofen (ADVIL,MOTRIN) tablet 400 mg  400 mg Oral Q6H PRN Catalina Gravelhomspon, Jacqueline, NP   400 mg at 11/18/18 0755  . lisinopril (PRINIVIL,ZESTRIL) tablet 20 mg  20 mg Oral Daily Clapacs, Jackquline DenmarkJohn T, MD   20 mg at 11/28/18 0745  . loratadine (CLARITIN) tablet 10 mg  10 mg Oral Daily Catalina Gravelhomspon, Jacqueline, NP   10 mg at 11/28/18 0745  . LORazepam (ATIVAN) tablet 1 mg  1 mg Oral Q4H PRN He, Jun, MD   1 mg at 11/25/18 0820  . magnesium hydroxide (MILK OF MAGNESIA) suspension 30 mL  30 mL Oral Daily PRN Catalina Gravelhomspon, Jacqueline, NP      . multivitamin with minerals tablet 1 tablet  1 tablet Oral Daily Clapacs, Jackquline DenmarkJohn T, MD   1 tablet at 11/28/18 0746  . OLANZapine zydis (ZYPREXA) disintegrating tablet 20 mg  20 mg Oral QHS Clapacs, Jackquline DenmarkJohn T, MD   20 mg at 11/27/18 2116  . risperiDONE (RISPERDAL M-TABS) disintegrating tablet 1 mg  1 mg Oral Q6H PRN Clapacs, Jackquline DenmarkJohn T, MD   1 mg at 11/25/18 16100821  . sertraline (ZOLOFT) tablet 100 mg  100 mg Oral QHS Clapacs, Jackquline DenmarkJohn T, MD   100 mg at 11/27/18 2117  . traZODone (DESYREL) tablet 50 mg  50 mg Oral QHS Catalina Gravelhomspon, Jacqueline, NP   50 mg at 11/27/18 2117    Lab Results: No results found for this or any previous visit (from the past 48 hour(s)).  Blood Alcohol level:  Lab Results  Component Value Date   ETH <10 11/09/2018    Metabolic Disorder Labs: No results found for: HGBA1C, MPG No results found for: PROLACTIN No results found for: CHOL, TRIG, HDL, CHOLHDL, VLDL, LDLCALC  Physical Findings: AIMS: Facial and Oral Movements Muscles of Facial Expression: None, normal Lips and Perioral Area: None, normal Jaw: None, normal Tongue: None, normal,Extremity Movements Upper (arms, wrists, hands, fingers): None, normal Lower (legs, knees, ankles, toes): None, normal, Trunk Movements Neck, shoulders, hips: None, normal, Overall Severity Severity of abnormal movements (highest score from questions above): None, normal Incapacitation due to abnormal  movements: None, normal Patient's awareness of abnormal movements (rate only patient's report): No Awareness, Dental Status Current problems with teeth and/or dentures?: No Does patient usually wear dentures?: No  CIWA:    COWS:  COWS Total Score: 6  Musculoskeletal: Strength & Muscle Tone: within normal limits Gait & Station: normal Patient leans: N/A  Psychiatric Specialty Exam: Physical Exam   ROS   Blood pressure 121/76, pulse 87, temperature 98.6 F (37 C), temperature source Oral, resp. rate 17, height 5\' 1"  (1.549 m), weight 68.9  kg, last menstrual period 11/09/2018, SpO2 100 %.Body mass index is 28.72 kg/m.  General Appearance: casual  Eye Contact:  fair  Speech:  Normal Rate  Volume: decreased  Mood:  Labile, dysthymic  Affect:  Constricted  Thought Process:  Coherent, illogical  Orientation:  Full (Time, Place, and Person)  Thought Content:  illogical  Suicidal Thoughts:  No  Homicidal Thoughts:  No  Memory:  Immediate;   Fair Recent;   Poor Remote;   Fair  Judgement:  limited  Insight:  poor  Psychomotor Activity:  Normal  Concentration:  Concentration: Fair and Attention Span: Fair  Recall:  [employer] of Knowledge:  Fair  Language:  Good  Akathisia:  No  Handed:  Right  AIMS (if indicated):     Assets:  Desire for Improvement Housing Physical Health  ADL's:  Intact  Cognition:  WNL  Sleep:  Number of Hours: 5     Treatment Plan Summary: Daily contact with patient to assess and evaluate symptoms and progress in treatment   37yo F who presents in [facility] inpatient psychiatric unit due to psychosis.  Patient continues to express paranoid thoughts. Her insight and judgement are poor. No safety concerns currently. No med side effects. Will continue current medication regimen and encourage the patient to consider ECT.  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  Plan: -continue inpatient psych admission; 15-minute checks;  daily contact with patient to assess and evaluate symptoms and progress in treatment.   -continue Zypreza  PO QHS for psychosis, Zoloft  PO QHS for depression, Trazodone  PO QHS for sleep.  -consider ECT due to lack of significant improvement on medications.    -psychoeducation;    -Disposition: to be determined.  Thalia Party, MD 11/28/2018, 10:02 AM

## 2018-11-29 DIAGNOSIS — F23 Brief psychotic disorder: Secondary | ICD-10-CM

## 2018-11-29 LAB — URINALYSIS, ROUTINE W REFLEX MICROSCOPIC
Bilirubin Urine: NEGATIVE
Glucose, UA: NEGATIVE mg/dL
Hgb urine dipstick: NEGATIVE
Ketones, ur: NEGATIVE mg/dL
Leukocytes,Ua: NEGATIVE
Nitrite: NEGATIVE
Protein, ur: NEGATIVE mg/dL
Specific Gravity, Urine: 1.006 (ref 1.005–1.030)
pH: 7 (ref 5.0–8.0)

## 2018-11-29 NOTE — Plan of Care (Signed)
Pt stated "I cannot answer any questions at this time" when I tried to assess her. Will continue to monitor. Torrie Mayers RN Problem: Education: Goal: Utilization of techniques to improve thought processes will improve Outcome: Not Progressing Goal: Knowledge of the prescribed therapeutic regimen will improve Outcome: Progressing   Problem: Coping: Goal: Coping ability will improve Outcome: Progressing Goal: Will verbalize feelings Outcome: Progressing   Problem: Health Behavior/Discharge Planning: Goal: Identification of resources available to assist in meeting health care needs will improve Outcome: Not Progressing   Problem: Self-Concept: Goal: Will verbalize positive feelings about self Outcome: Not Progressing   Problem: Coping: Goal: Coping ability will improve Outcome: Progressing   Problem: Education: Goal: Knowledge of [place] [facility] Education information/materials will improve Outcome: Progressing Goal: Emotional status will improve Outcome: Not Progressing Goal: Mental status will improve Outcome: Not Progressing Goal: Verbalization of understanding the information provided will improve Outcome: Not Progressing   Problem: Activity: Goal: Interest or engagement in activities will improve Outcome: Progressing Goal: Sleeping patterns will improve Outcome: Progressing   Problem: Coping: Goal: Ability to verbalize frustrations and anger appropriately will improve Outcome: Not Progressing Goal: Ability to demonstrate self-control will improve Outcome: Progressing   Problem: Health Behavior/Discharge Planning: Goal: Identification of resources available to assist in meeting health care needs will improve Outcome: Not Progressing Goal: Compliance with treatment plan for underlying cause of condition will improve Outcome: Progressing   Problem: Physical Regulation: Goal: Ability to maintain clinical measurements within normal limits will improve Outcome:  Progressing   Problem: Safety: Goal: Periods of time without injury will increase Outcome: Progressing   Problem: Activity: Goal: Will verbalize the importance of balancing activity with adequate rest periods Outcome: Not Progressing   Problem: Education: Goal: Will be free of psychotic symptoms Outcome: Progressing Goal: Knowledge of the prescribed therapeutic regimen will improve Outcome: Progressing   Problem: Coping: Goal: Coping ability will improve Outcome: Progressing Goal: Will verbalize feelings Outcome: Not Progressing   Problem: Health Behavior/Discharge Planning: Goal: Compliance with prescribed medication regimen will improve Outcome: Progressing   Problem: Nutritional: Goal: Ability to achieve adequate nutritional intake will improve Outcome: Progressing   Problem: Role Relationship: Goal: Ability to communicate needs accurately will improve Outcome: Not Progressing Goal: Ability to interact with others will improve Outcome: Progressing   Problem: Safety: Goal: Ability to redirect hostility and anger into socially appropriate behaviors will improve Outcome: Progressing Goal: Ability to remain free from injury will improve Outcome: Progressing   Problem: Self-Care: Goal: Ability to participate in self-care as condition permits will improve Outcome: Progressing   Problem: Self-Concept: Goal: Will verbalize positive feelings about self Outcome: Not Progressing

## 2018-11-29 NOTE — Progress Notes (Signed)
[facility] MD Progress Note  11/29/2018 1:25 PM Ashley Griffin  MRN:  [id number] Subjective:    37yo F who presents in [facility] inpatient psychiatric unit due to psychosis. Chart reviewed, case discussed with treatment team, patient seen.  Patient is calm, she is compliant with medications. She appears confused about her medications. She says "I think the medication is working for me" at the same time she cannot explain why is she taking medications. She reports vivid dreams, we discussed that it could be related to Zoloft. She is asking why she is on Lisinopril - we discussed indications. She remembers that Dr Toni Amend discussed the possibility of starting ECT with her. She again wants to discuss with her family and asked to be discharged first. She denies suicidal or homicidal thoughts. No current physical complaints.    Principal Problem: Brief reactive psychosis (HCC) Diagnosis: Principal Problem:   Brief reactive psychosis (HCC) Active Problems:   Depression   Anxiety state  Total Time spent with patient: 20 minutes  Past Psychiatric History: see H&P  Past Medical History: History reviewed. No pertinent past medical history. History reviewed. No pertinent surgical history. Family History: History reviewed. No pertinent family history. Family Psychiatric  History: see H&P Social History:  Social History   Substance and Sexual Activity  Alcohol Use Not Currently  . Frequency: Never     Social History   Substance and Sexual Activity  Drug Use Not on file    Social History   Socioeconomic History  . Marital status: Married    Spouse name: Not on file  . Number of children: Not on file  . Years of education: Not on file  . Highest education level: Not on file  Occupational History  . Not on file  Social Needs  . Financial resource strain: Not on file  . Food insecurity:    Worry: Not on file    Inability: Not on file  . Transportation needs:    Medical: Not on file   Non-medical: Not on file  Tobacco Use  . Smoking status: Never Smoker  . Smokeless tobacco: Never Used  Substance and Sexual Activity  . Alcohol use: Not Currently    Frequency: Never  . Drug use: Not on file  . Sexual activity: Not on file  Lifestyle  . Physical activity:    Days per week: Not on file    Minutes per session: Not on file  . Stress: Not on file  Relationships  . Social connections:    Talks on phone: Not on file    Gets together: Not on file    Attends religious service: Not on file    Active member of club or organization: Not on file    Attends meetings of clubs or organizations: Not on file    Relationship status: Not on file  Other Topics Concern  . Not on file  Social History Narrative  . Not on file   Additional Social History:                         Sleep: Fair  Appetite:  Good  Current Medications: Current Facility-Administered Medications  Medication Dose Route Frequency Provider Last Rate Last Dose  . acetaminophen (TYLENOL) tablet 650 mg  650 mg Oral Q6H PRN Catalina Gravel, NP   650 mg at 11/24/18 2226  . alum & mag hydroxide-simeth (MAALOX/MYLANTA) 200-200-20 MG/5ML suspension 30 mL  30 mL Oral Q4H PRN Catalina Gravel,  NP      . feeding supplement (ENSURE ENLIVE) (ENSURE ENLIVE) liquid 237 mL  237 mL Oral BID BM Clapacs, John T, MD   237 mL at 11/28/18 1542  . hydrOXYzine (ATARAX/VISTARIL) tablet 25 mg  25 mg Oral Q6H PRN Catalina Gravelhomspon, Jacqueline, NP   25 mg at 11/28/18 2054  . ibuprofen (ADVIL,MOTRIN) tablet 400 mg  400 mg Oral Q6H PRN Catalina Gravelhomspon, Jacqueline, NP   400 mg at 11/18/18 0755  . lisinopril (PRINIVIL,ZESTRIL) tablet 20 mg  20 mg Oral Daily Clapacs, Jackquline DenmarkJohn T, MD   20 mg at 11/29/18 0759  . loratadine (CLARITIN) tablet 10 mg  10 mg Oral Daily Catalina Gravelhomspon, Jacqueline, NP   10 mg at 11/29/18 0800  . LORazepam (ATIVAN) tablet 1 mg  1 mg Oral Q4H PRN He, Jun, MD   1 mg at 11/25/18 0820  . magnesium hydroxide (MILK OF MAGNESIA)  suspension 30 mL  30 mL Oral Daily PRN Catalina Gravelhomspon, Jacqueline, NP      . multivitamin with minerals tablet 1 tablet  1 tablet Oral Daily Clapacs, Jackquline DenmarkJohn T, MD   1 tablet at 11/29/18 0759  . OLANZapine zydis (ZYPREXA) disintegrating tablet 20 mg  20 mg Oral QHS Clapacs, Jackquline DenmarkJohn T, MD   20 mg at 11/28/18 2052  . risperiDONE (RISPERDAL M-TABS) disintegrating tablet 1 mg  1 mg Oral Q6H PRN Clapacs, Jackquline DenmarkJohn T, MD   1 mg at 11/25/18 16100821  . sertraline (ZOLOFT) tablet 100 mg  100 mg Oral QHS Clapacs, Jackquline DenmarkJohn T, MD   100 mg at 11/28/18 2053  . traZODone (DESYREL) tablet 50 mg  50 mg Oral QHS Catalina Gravelhomspon, Jacqueline, NP   50 mg at 11/28/18 2054    Lab Results: No results found for this or any previous visit (from the past 48 hour(s)).  Blood Alcohol level:  Lab Results  Component Value Date   ETH <10 11/09/2018    Metabolic Disorder Labs: No results found for: HGBA1C, MPG No results found for: PROLACTIN No results found for: CHOL, TRIG, HDL, CHOLHDL, VLDL, LDLCALC  Physical Findings: AIMS: Facial and Oral Movements Muscles of Facial Expression: None, normal Lips and Perioral Area: None, normal Jaw: None, normal Tongue: None, normal,Extremity Movements Upper (arms, wrists, hands, fingers): None, normal Lower (legs, knees, ankles, toes): None, normal, Trunk Movements Neck, shoulders, hips: None, normal, Overall Severity Severity of abnormal movements (highest score from questions above): None, normal Incapacitation due to abnormal movements: None, normal Patient's awareness of abnormal movements (rate only patient's report): No Awareness, Dental Status Current problems with teeth and/or dentures?: No Does patient usually wear dentures?: No  CIWA:    COWS:  COWS Total Score: 6  Musculoskeletal: Strength & Muscle Tone: within normal limits Gait & Station: normal Patient leans: N/A  Psychiatric Specialty Exam: Physical Exam   ROS   Blood pressure 123/86, pulse 82, temperature 98.4 F (36.9 C),  temperature source Oral, resp. rate 18, height 5\' 1"  (1.549 m), weight 68.9 kg, last menstrual period 11/09/2018, SpO2 100 %.Body mass index is 28.72 kg/m.  General Appearance: casual  Eye Contact:  fair  Speech:  Normal Rate  Volume: decreased  Mood:  Labile, dysthymic  Affect:  Constricted  Thought Process:  Coherent, illogical  Orientation:  Full (Time, Place, and Person)  Thought Content:  illogical  Suicidal Thoughts:  No  Homicidal Thoughts:  No  Memory:  Immediate;   Fair Recent;   Poor Remote;   Fair  Judgement:  limited  Insight:  poor  Psychomotor Activity:  Normal  Concentration:  Concentration: Fair and Attention Span: Fair  Recall:  [employer] of Knowledge:  Fair  Language:  Good  Akathisia:  No  Handed:  Right  AIMS (if indicated):     Assets:  Desire for Improvement Housing Physical Health  ADL's:  Intact  Cognition:  WNL  Sleep:  Number of Hours: 5     Treatment Plan Summary: Daily contact with patient to assess and evaluate symptoms and progress in treatment   37yo F who presents in [facility] inpatient psychiatric unit due to psychosis.  Patient continues to express paranoid thoughts. Her insight and judgement are poor. No safety concerns currently. No med side effects. Will continue current medication regimen and encourage the patient to consider ECT.  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  Plan: -continue inpatient psych admission; 15-minute checks; daily contact with patient to assess and evaluate symptoms and progress in treatment.   -continue Zypreza  PO QHS for psychosis, Zoloft  PO QHS for depression, Trazodone  PO QHS for sleep.  -consider ECT due to lack of significant improvement on medications.    -psychoeducation;    -Disposition: to be determined.  Thalia Party, MD 11/29/2018, 1:25 PM

## 2018-11-29 NOTE — Progress Notes (Signed)
Recreation Therapy Notes    Date: 11/29/2018  Time: 9:30 am  Location: Craft Room  Behavioral response: Appropriate  Intervention Topic: Happiness  Discussion/Intervention:  Group content today was focused on Happiness. The group defined happiness and described where happiness comes from. Individuals identified what makes them happy and how they go about making others happy. Patients expressed things that stop them from being happy and ways they can improve their happiness. The group stated reasons why it is important to be happy. The group participated in the intervention "My Happiness", where they had a chance to identify and express things that make them happy. Clinical Observations/Feedback:  Patient came to group and stated happiness is important because when you are happy you can help others. She participated in the intervention but became tearful during group and left group early.  Janvi Ammar LRT/CTRS         Arrion Burruel 11/29/2018 10:42 AM

## 2018-11-29 NOTE — Tx Team (Signed)
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 acetaminophen (TYLENOL) tablet 650 mg  650 mg Oral Q6H PRN Catalina Gravel, NP   650 mg at 11/24/18 2226  . alum & mag hydroxide-simeth (MAALOX/MYLANTA) 200-200-20 MG/5ML suspension 30 mL  30 mL Oral Q4H PRN Thomspon, Adela Lank, NP      . feeding supplement (ENSURE ENLIVE) (ENSURE ENLIVE) liquid 237 mL  237 mL Oral BID BM Clapacs, John T, MD   237 mL at 11/28/18 1542  . hydrOXYzine (ATARAX/VISTARIL) tablet 25 mg  25 mg Oral Q6H PRN Catalina Gravel, NP   25 mg at 11/28/18 2054  . ibuprofen (ADVIL,MOTRIN) tablet 400 mg  400 mg Oral Q6H PRN Catalina Gravel, NP   400 mg at 11/18/18 0755  . lisinopril (PRINIVIL,ZESTRIL) tablet 20 mg  20 mg Oral Daily Clapacs, Jackquline Denmark, MD   20 mg at 11/29/18 0759  . loratadine (CLARITIN) tablet 10 mg  10 mg Oral Daily Catalina Gravel, NP   10 mg at 11/29/18 0800  . LORazepam (ATIVAN) tablet 1 mg  1 mg Oral Q4H PRN He, Jun, MD   1 mg at 11/25/18 0820  . magnesium hydroxide (MILK OF MAGNESIA) suspension 30 mL  30 mL Oral Daily PRN Catalina Gravel, NP      . multivitamin with minerals tablet 1 tablet  1 tablet Oral Daily Clapacs, Jackquline Denmark, MD   1 tablet at 11/29/18 0759  . OLANZapine zydis (ZYPREXA) disintegrating tablet 20 mg  20 mg Oral QHS Clapacs, Jackquline Denmark, MD   20 mg at 11/28/18 2052  . risperiDONE (RISPERDAL M-TABS) disintegrating tablet 1 mg  1 mg Oral Q6H PRN Clapacs, Jackquline Denmark, MD   1 mg at 11/25/18 4818  . sertraline (ZOLOFT) tablet 100 mg  100 mg Oral QHS Clapacs, Jackquline Denmark, MD   100 mg at 11/28/18 2053  . traZODone  (DESYREL) tablet 50 mg  50 mg Oral QHS Catalina Gravel, NP   50 mg at 11/28/18 2054   PTA Medications: Medications Prior to Admission  Medication Sig Dispense Refill Last Dose  . diltiazem (CARDIZEM) 60 MG tablet Take 60 mg by mouth every 12 (twelve) hours.     . fexofenadine (ALLEGRA) 180 MG tablet Take 180 mg by mouth as needed.     . hydrOXYzine (ATARAX/VISTARIL) 25 MG tablet Take 25 mg by mouth every 6 (six) hours as needed for sleep.   11/09/2018 at 0700  . ibuprofen (ADVIL,MOTRIN) 200 MG tablet Take 600 mg by mouth every 6 (six) hours as needed for pain.     Marland Kitchen sertraline (ZOLOFT) 50 MG tablet Take 25 mg by mouth daily.     . traZODone (DESYREL) 50 MG tablet Take 50-100 mg by mouth at bedtime.       Patient Stressors: Financial difficulties Marital or family conflict Medication change or noncompliance Occupational concerns  Patient Strengths: Average or above average Radio producer for treatment/growth Religious Affiliation  Treatment Modalities: Medication Management, Group therapy, Case management,  1 to 1 session with clinician, Psychoeducation, Recreational therapy.   Physician Treatment Plan for Primary Diagnosis: Brief reactive psychosis (HCC) Long Term Goal(s): Improvement in symptoms so as  ready for discharge Improvement in symptoms so as ready for discharge   Short Term Goals: Ability to verbalize feelings will improve Ability to demonstrate self-control will improve Ability to identify and develop effective coping behaviors will improve Compliance with prescribed medications will improve  Medication Management: Evaluate patient's response, side effects, and tolerance of medication regimen.  Therapeutic Interventions: 1 to 1 sessions, Unit Group sessions and Medication administration.  Evaluation of Outcomes: Progressing  Physician Treatment Plan for Secondary Diagnosis: Principal Problem:   Brief reactive psychosis  (HCC) Active Problems:   Depression   Anxiety state  Long Term Goal(s): Improvement in symptoms so as ready for discharge Improvement in symptoms so as ready for discharge   Short Term Goals: Ability to verbalize feelings will improve Ability to demonstrate self-control will improve Ability to identify and develop effective coping behaviors will improve Compliance with prescribed medications will improve     Medication Management: Evaluate patient's response, side effects, and tolerance of medication regimen.  Therapeutic Interventions: 1 to 1 sessions, Unit Group sessions and Medication administration.  Evaluation of Outcomes: Progressing   RN Treatment Plan for Primary Diagnosis: Brief reactive psychosis (HCC) Long Term Goal(s): Knowledge of disease and therapeutic regimen to maintain health will improve  Short Term Goals: Ability to demonstrate self-control, Ability to participate in decision making will improve, Ability to verbalize feelings will improve, Ability to disclose and discuss suicidal ideas and Ability to identify and develop effective coping behaviors will improve  Medication Management: RN will administer medications as ordered by provider, will assess and evaluate patient's response and provide education to patient for prescribed medication. RN will report any adverse and/or side effects to prescribing provider.  Therapeutic Interventions: 1 on 1 counseling sessions, Psychoeducation, Medication administration, Evaluate responses to treatment, Monitor vital signs and CBGs as ordered, Perform/monitor CIWA, COWS, AIMS and Fall Risk screenings as ordered, Perform wound care treatments as ordered.  Evaluation of Outcomes: Progressing   LCSW Treatment Plan for Primary Diagnosis: Brief reactive psychosis (HCC) Long Term Goal(s): Safe transition to appropriate next level of care at discharge, Engage patient in therapeutic group addressing interpersonal concerns.  Short  Term Goals: Engage patient in aftercare planning with referrals and resources, Increase social support, Increase ability to appropriately verbalize feelings, Increase emotional regulation and Facilitate acceptance of mental health diagnosis and concerns  Therapeutic Interventions: Assess for all discharge needs, 1 to 1 time with Social worker, Explore available resources and support systems, Assess for adequacy in community support network, Educate family and significant other(s) on suicide prevention, Complete Psychosocial Assessment, Interpersonal group therapy.  Evaluation of Outcomes: Progressing   Progress in Treatment: Attending groups: Yes. Participating in groups: Yes. Taking medication as prescribed: Yes. Toleration medication: Yes. Family/Significant other contact made: Yes, individual(s) contacted:  SPE completed with the patient's husband.   Patient understands diagnosis: No. Discussing patient identified problems/goals with staff: Yes. Medical problems stabilized or resolved: Yes. Denies suicidal/homicidal ideation: Yes. Issues/concerns per patient self-inventory: No. Other: none  New problem(s) identified: No, Describe:  none  New Short Term/Long Term Goal(s): elimination of symptoms of psychosis, medication management for mood stabilization; elimination of SI thoughts; development of comprehensive mental wellness plan.  Patient Goals:  "get better and go home.  Rest. I wasn't sleeping."  Discharge Plan or Barriers: Patient at this time is remains inappropriate for discharge. Patient remains diorganized and at times is hyper-religious and responding to internal stimuli, however, she appears to have moments where she is clear and rational in thinking.  11/29/18- Pt has moments of rational thinking but still experiencing delusional thinking. Pt has been compliant with medication. Physician has discussed with her beginning ECT.  Reason for Continuation of Hospitalization:  Anxiety Depression Mania Medication stabilization  Estimated Length of Stay: TBD  Recreational Therapy: Patient Stressors: N/A Patient Goal: Patient will engage in groups without prompting or encouragement from LRT x3 group sessions within 5 recreation therapy group sessions  Attendees: Patient:  11/29/2018 9:46 AM  Physician: Thalia Party 11/29/2018 9:46 AM  Nursing: Larina Bras  11/29/2018 9:46 AM  RN Care Manager: 11/29/2018 9:46 AM  Social Worker: Lowella Dandy Halls Moton 11/29/2018 9:46 AM  Recreational Therapist:  11/29/2018 9:46 AM  Other:  11/29/2018 9:46 AM  Other:  11/29/2018 9:46 AM  Other: 11/29/2018 9:46 AM    Scribe for Treatment Team: Suzan Slick, LCSW 11/29/2018 9:46 AM

## 2018-11-29 NOTE — [facility] Group Notes (Signed)
Emotional Regulation 11/29/2018 1PM  Type of Therapy/Topic:  Group Therapy:  Emotion Regulation  Participation Level:  Active   Description of Group:   The purpose of this group is to assist patients in learning to regulate negative emotions and experience positive emotions. Patients will be guided to discuss ways in which they have been vulnerable to their negative emotions. These vulnerabilities will be juxtaposed with experiences of positive emotions or situations, and patients will be challenged to use positive emotions to combat negative ones. Special emphasis will be placed on coping with negative emotions in conflict situations, and patients will process healthy conflict resolution skills.  Therapeutic Goals: 1. Patient will identify two positive emotions or experiences to reflect on in order to balance out negative emotions 2. Patient will label two or more emotions that they find the most difficult to experience 3. Patient will demonstrate positive conflict resolution skills through discussion and/or role plays  Summary of Patient Progress:  Actively and appropriately engaged in the group. Patient was able to provide support and validation to other group members.Patient practiced active listening when interacting with the facilitator and other group members. Patient demonstrated good insight and identified her husband as a support and discussed with the group healthy coping skills.     Therapeutic Modalities:   Cognitive Behavioral Therapy Feelings Identification Dialectical Behavioral Therapy   Suzan Slick, LCSW 11/29/2018 2:01 PM

## 2018-11-29 NOTE — Plan of Care (Signed)
  Problem: Education: Goal: Will be free of psychotic symptoms Outcome: Not Progressing  Patient appears psychotic; responding to internal stimuli

## 2018-11-29 NOTE — Progress Notes (Signed)
Patient alert and oriented x 4, denies SI/HI/AVH but noted responding to internal stimuli, affect is blunted, thoughts are disorganized, incoherent and she appears confused to place and situation. Patient at different times was noted pacing on the unit, tearful and restless, and when she was approached and asked what is the problem she stated " l don't know"  Patient was always reoriented and redirected.  Patient during medication time became hypervigilant and suspicious of staff, asking numerous questions and very argumentative before she finally  took her night medication. Patient was offered emotional support and encouraged to attend evening wrap up group, she  attended evening group and was appropriate. 15 minutes safety checks maintained will continue to monitor

## 2018-11-29 NOTE — Progress Notes (Signed)
Pt has been free of paranoia and hallucinations all day. Pt says that she feels so much better. Pt is having organized thought processes. She still cannot rate her depresion or anxiety however says that her depression is coming from not seeing her children and she has always had some anxiety. Torrie Mayers RN

## 2018-11-30 ENCOUNTER — Other Ambulatory Visit: Payer: Self-pay | Admitting: Psychiatry

## 2018-11-30 LAB — CBC
HCT: 40.2 % (ref 36.0–46.0)
Hemoglobin: 12.9 g/dL (ref 12.0–15.0)
MCH: 28 pg (ref 26.0–34.0)
MCHC: 32.1 g/dL (ref 30.0–36.0)
MCV: 87.4 fL (ref 80.0–100.0)
Platelets: 314 10*3/uL (ref 150–400)
RBC: 4.6 MIL/uL (ref 3.87–5.11)
RDW: 13.1 % (ref 11.5–15.5)
WBC: 5.9 10*3/uL (ref 4.0–10.5)
nRBC: 0 % (ref 0.0–0.2)

## 2018-11-30 LAB — TSH: TSH: 2.335 u[IU]/mL (ref 0.350–4.500)

## 2018-11-30 MED ORDER — OLANZAPINE 20 MG PO TABS: 20.0000 mg | ORAL_TABLET | Freq: Every day | ORAL | 1 refills | Status: AC

## 2018-11-30 MED ORDER — LISINOPRIL 20 MG PO TABS: 20.0000 mg | ORAL_TABLET | Freq: Every day | ORAL | 1 refills | Status: AC

## 2018-11-30 MED ORDER — RISPERIDONE 1 MG PO TBDP: 1.0000 mg | ORAL_TABLET | Freq: Four times a day (QID) | ORAL | 1 refills | Status: AC | PRN

## 2018-11-30 MED ORDER — LORATADINE 10 MG PO TABS: 10.0000 mg | ORAL_TABLET | Freq: Every day | ORAL | 1 refills | Status: AC

## 2018-11-30 MED ORDER — RISPERIDONE 1 MG PO TABS: 1.0000 mg | ORAL_TABLET | Freq: Three times a day (TID) | ORAL | 1 refills | Status: AC | PRN

## 2018-11-30 MED ORDER — TRAZODONE HCL 50 MG PO TABS: 50.0000 mg | ORAL_TABLET | Freq: Every day | ORAL | 1 refills | Status: AC

## 2018-11-30 MED ORDER — SERTRALINE HCL 100 MG PO TABS: 100.0000 mg | ORAL_TABLET | Freq: Every day | ORAL | 1 refills | Status: AC

## 2018-11-30 NOTE — Progress Notes (Signed)
Recreation Therapy Notes   Date: 11/30/2018  Time: 9:30 am  Location: Craft Room  Behavioral response: Appropriate  Intervention Topic: Creative Expressions  Discussion/Intervention:  Group content on today was focused on creative expressions. The group defined creative expressions and ways they use creative expressions. Individual identified other positive ways creative expressions can be used and why it is important to express yourself. Patients participated in the intervention "expressive painting", where they had a chance to creatively express themselves. Clinical Observations/Feedback:  Patient came to group and explained that she using her strengths when she participates in creative expressions. She identified her strengths as singing and listening to music. Individual was social with peers and staff while participating in group.  Ashley Griffin LRT/CTRS         Ashley Griffin 11/30/2018 11:28 AM

## 2018-11-30 NOTE — Progress Notes (Signed)
D - Patient was in her room upon arrival to the unit. Patient was pleasant during assessment and medication administration. Patient denies SI/HI/AVH, pain and depression. Patient denies anxiety with this Clinical research associate but presented anxious. See MAR. Patient stated to this writer that she was going to be discharged tomorrow and she is ready to be out of here. Patient stated she didn't feel safe here, patient was given education.   A - Patient compliant with medication administration per MD orders. Patient refused a medication at first but then came back up about an hour later saying she wanted to take her medications because she didn't want to be noncompliant. Patient given education. Patient given support and encouragement to be active in her treatment plan. Patient informed to let staff know if there are any issues or problems on the unit.   R - Patient being monitored Q 15 minutes for safety per unit protocol. Patient remains safe on the unit.

## 2018-11-30 NOTE — Progress Notes (Signed)
D: Pt during assessments denies SI/HI/AVH, contracts for safety. Pt is pleasant and cooperative, endorses a mostly normal mood. Pt. Reports improved confusion. Pt. Behavior is improved and verbalizing feelings more appropriately.    A: Q x 15 minute observation checks to be completed for safety. Patient was provided with education.  Patient was given/offered medications per orders. Patient  was encourage to attend groups, participate in unit activities and continue with plan of care. Pt. Chart and plans of care reviewed. Pt. Given support and encouragement.   R: Patient is complaint with medications and unit procedures. Pt. Observed eating good, attends all meals. Pt. Goes to groups. Pt. Interactions with staff and peers improved.

## 2018-11-30 NOTE — Progress Notes (Signed)
Pastoral Care visit   11/30/18 1400  Clinical Encounter Type  Visited With Patient  Visit Type Follow-up;Psychological support;Spiritual support;Behavioral Health  Referral From Patient  Consult/Referral To Chaplain  Spiritual Encounters  Spiritual Needs Emotional   Pt shared her distress over being away from family. She believes she has stayed here longer than she should have.  Pt shared about her faith, trusting in God, stress over being away from her children and husband, and stress over her work. She agreed that taking time off of work once she is discharged is good for her emotional well being. Pt seems clear in her thinking though she indicates that she cannot remember the specific details of events leading to her admission to hosp.  Milinda Antis, [address]

## 2018-11-30 NOTE — Progress Notes (Signed)
D: Patient denies SI/HI/AVH, able to contract for safety upon discharge at this time. Pt appears calm and cooperative, and no distress noted. Pt. Denies pain.   A: Pt. Given extensive discharge education that the patient verbalizes understanding.   R:  Pt States she will comply with outpatient services/discharge planning put into place, and take MEDS as prescribed. All Personal items in locker returned to pt. Pt escorted out of the building.

## 2018-11-30 NOTE — [facility] Group Notes (Signed)
LCSW Group Therapy Note   11/30/2018 1:36 PM   Type of Therapy and Topic:  Group Therapy:  Overcoming Obstacles   Participation Level:  Minimal   Description of Group:    In this group patients will be encouraged to explore what they see as obstacles to their own wellness and recovery. They will be guided to discuss their thoughts, feelings, and behaviors related to these obstacles. The group will process together ways to cope with barriers, with attention given to specific choices patients can make. Each patient will be challenged to identify changes they are motivated to make in order to overcome their obstacles. This group will be process-oriented, with patients participating in exploration of their own experiences as well as giving and receiving support and challenge from other group members.   Therapeutic Goals: 1. Patient will identify personal and current obstacles as they relate to admission. 2. Patient will identify barriers that currently interfere with their wellness or overcoming obstacles.  3. Patient will identify feelings, thought process and behaviors related to these barriers. 4. Patient will identify two changes they are willing to make to overcome these obstacles:      Summary of Patient Progress Pt was present for the majority of the group, but did not participate much. Pt engaged a little bit when discussing coping skills and reported that a negative coping skill is worrying.      Therapeutic Modalities:   Cognitive Behavioral Therapy Solution Focused Therapy Motivational Interviewing Relapse Prevention Therapy  Iris Pert, MSW, LCSW Clinical Social Work 11/30/2018 1:36 PM

## 2018-11-30 NOTE — Progress Notes (Signed)
Recreation Therapy Notes  INPATIENT RECREATION TR PLAN  Patient Details Name: Ashley Griffin MRN: [id number] DOB: 03-26-1981 Today's Date: 11/30/2018  Rec Therapy Plan Is patient appropriate for Therapeutic Recreation?: Yes Treatment times per week: at least 3 Estimated Length of Stay: 5-7days TR Treatment/Interventions: Group participation (Comment)  Discharge Criteria Pt will be discharged from therapy if:: Discharged Treatment plan/goals/alternatives discussed and agreed upon by:: Patient/family  Discharge Summary Short term goals set: Patient will engage in groups without prompting or encouragement from LRT x3 group sessions within 5 recreation therapy group sessions Short term goals met: Complete Progress toward goals comments: Groups attended Which groups?: Stress management, Other (Comment)(Happiness, self-care, Problem Solving, Relaxation, Emotions, creative expressions) Reason goals not met: N/A Therapeutic equipment acquired: N/A Reason patient discharged from therapy: Discharge from hospital Pt/family agrees with progress & goals achieved: Yes Date patient discharged from therapy: 11/30/18   Tangia Pinard 11/30/2018, 2:19 PM

## 2018-11-30 NOTE — Plan of Care (Signed)
Patient presented with a better affect than what this writer has seen from the patient. Patient presented with some paranoid but was redirectable and was also complaint with medication and procedures on the unit.    Problem: Education: Goal: Emotional status will improve Outcome: Progressing Goal: Mental status will improve Outcome: Progressing

## 2018-11-30 NOTE — Plan of Care (Signed)
Pt. Reports improve coping and is verbalizing feeling better. Pt. Is complaint with medications. Pt. Denies si/hi/avh, able to contract for safety. Pt. Behavior and presentation is improved overall, not showing as much bizarre behavior and or confusion.    Problem: Coping: Goal: Coping ability will improve Outcome: Progressing Goal: Will verbalize feelings Outcome: Progressing   Problem: Coping: Goal: Coping ability will improve Outcome: Progressing   Problem: Health Behavior/Discharge Planning: Goal: Compliance with treatment plan for underlying cause of condition will improve Outcome: Progressing   Problem: Safety: Goal: Periods of time without injury will increase Outcome: Progressing   Problem: Education: Goal: Will be free of psychotic symptoms Outcome: Progressing

## 2018-11-30 NOTE — Progress Notes (Signed)
  [facility] Adult Case Management Discharge Plan :  Will you be returning to the same living situation after discharge:  Yes,  pt lives with husband and children At discharge, do you have transportation home?: Yes,  husband will provide transportation Do you have the ability to pay for your medications: Yes,  insurance  Release of information consent forms completed and in the chart;  Patient's signature needed at discharge.  Patient to Follow up at: Follow-up Information    [facility] Follow up.   Why:  Zella Ball is a female faith based counselor in [place], [place] who accepts your insurance. She is currently only doing telehealth sessions due to COVID-19. Please call for more information and schedule an appointment. Thank you! Contact information: [facility] [address]  [place], [place] [address]  Phone: [phone]          Next level of care provider has access to [facility] Link:no  Safety Planning and Suicide Prevention discussed: Yes,  Oceane Strehle, husband  Have you used any form of tobacco in the last 30 days? (Cigarettes, Smokeless Tobacco, Cigars, and/or Pipes): No  Has patient been referred to the Quitline?: N/A patient is not a smoker  Patient has been referred for addiction treatment: N/A  Suzan Slick, LCSW 11/30/2018, 1:08 PM

## 2018-11-30 NOTE — Discharge Summary (Signed)
Physician Discharge Summary Note  Patient:  Ashley Griffin is an 38 y.o., female MRN:  [id number] DOB:  Nov 26, 1980 Patient phone:  [phone] (home)  Patient address:   [address] [place] [address],  Total Time spent with patient: 1 hour  Date of Admission:  11/09/2018 Date of Discharge: November 30, 2018  Reason for Admission: Patient was admitted through the emergency room where she presented with psychotic disorganized agitated bizarre behavior of fairly acute onset  Principal Problem: Brief reactive psychosis [facility]) Discharge Diagnoses: Principal Problem:   Brief reactive psychosis (HCC) Active Problems:   Depression   Anxiety state   Past Psychiatric History: No significant past psychiatric history no prior hospitalizations  Past Medical History: History reviewed. No pertinent past medical history. History reviewed. No pertinent surgical history. Family History: History reviewed. No pertinent family history. Family Psychiatric  History: None identified Social History:  Social History   Substance and Sexual Activity  Alcohol Use Not Currently  . Frequency: Never     Social History   Substance and Sexual Activity  Drug Use Not on file    Social History   Socioeconomic History  . Marital status: Married    Spouse name: Not on file  . Number of children: Not on file  . Years of education: Not on file  . Highest education level: Not on file  Occupational History  . Not on file  Social Needs  . Financial resource strain: Not on file  . Food insecurity:    Worry: Not on file    Inability: Not on file  . Transportation needs:    Medical: Not on file    Non-medical: Not on file  Tobacco Use  . Smoking status: Never Smoker  . Smokeless tobacco: Never Used  Substance and Sexual Activity  . Alcohol use: Not Currently    Frequency: Never  . Drug use: Not on file  . Sexual activity: Not on file  Lifestyle  . Physical activity:    Days per week: Not on file     Minutes per session: Not on file  . Stress: Not on file  Relationships  . Social connections:    Talks on phone: Not on file    Gets together: Not on file    Attends religious service: Not on file    Active member of club or organization: Not on file    Attends meetings of clubs or organizations: Not on file    Relationship status: Not on file  Other Topics Concern  . Not on file  Social History Narrative  . Not on file    Hospital Course: Patient admitted to the psychiatric unit.  15-minute checks in place.  On initial examination patient seemed to be having some improvement.  I thought at first she probably had a brief psychosis.  Prescribed modest dose of Seroquel initially as well as a low-dose of Zoloft.  Patient showed no improvement and in fact started to decline over several days.  Medicines were altered to Zyprexa at night for better antipsychotic treatment and increased dose of Zoloft.  As time went on she appeared to me to be more like a person with a psychotic depression.  Patient remained psychotic and disorganized for much of her hospital stay however the last couple days she has finally shown improvement.  Today she is not reporting any hallucinations and does not appear delusional.  She is able to stay organized and on topic during a conversation.  Spoke with her husband multiple times during her hospital stay with her consent including at discharge.  Patient and husband are both agreeable to the discharge plan.  She is being given options and referral for outpatient treatment and prescriptions for all of her medicines which at the current time include the Zyprexa and Zoloft as well as as needed Risperdal.  Psychoeducation and counseling completed.  Physical Findings: AIMS: Facial and Oral Movements Muscles of Facial Expression: None, normal Lips and Perioral Area: None, normal Jaw: None, normal Tongue: None, normal,Extremity Movements Upper (arms, wrists, hands, fingers):  None, normal Lower (legs, knees, ankles, toes): None, normal, Trunk Movements Neck, shoulders, hips: None, normal, Overall Severity Severity of abnormal movements (highest score from questions above): None, normal Incapacitation due to abnormal movements: None, normal Patient's awareness of abnormal movements (rate only patient's report): No Awareness, Dental Status Current problems with teeth and/or dentures?: No Does patient usually wear dentures?: No  CIWA:    COWS:  COWS Total Score: 6  Musculoskeletal: Strength & Muscle Tone: within normal limits Gait & Station: normal Patient leans: N/A  Psychiatric Specialty Exam: Physical Exam  Nursing note and vitals reviewed. Constitutional: She appears well-developed and well-nourished.  HENT:  Head: Normocephalic and atraumatic.  Eyes: Pupils are equal, round, and reactive to light. Conjunctivae are normal.  Neck: Normal range of motion.  Cardiovascular: Regular rhythm and normal heart sounds.  Respiratory: Effort normal.  GI: Soft.  Musculoskeletal: Normal range of motion.  Neurological: She is alert.  Skin: Skin is warm and dry.  Psychiatric: Judgment normal. Her affect is blunt. Her speech is delayed. She is slowed. Thought content is not paranoid. Cognition and memory are impaired. She expresses no homicidal and no suicidal ideation.    Review of Systems  Constitutional: Negative.   HENT: Negative.   Eyes: Negative.   Respiratory: Negative.   Cardiovascular: Negative.   Gastrointestinal: Negative.   Musculoskeletal: Negative.   Skin: Negative.   Neurological: Negative.   Psychiatric/Behavioral: Positive for memory loss. Negative for depression, hallucinations, substance abuse and suicidal ideas. The patient is nervous/anxious. The patient does not have insomnia.     Blood pressure 126/64, pulse 84, temperature 98.5 F (36.9 C), temperature source Oral, resp. rate 16, heig XHwrT$  (1.549 m), weight 70.3 kg, last menstrual  period 11/09/2018, SpO2 99 %.Body mass index is 29.29 kg/m.  General Appearance: Casual  Eye Contact:  Fair  Speech:  Clear and Coherent  Volume:  Decreased  Mood:  Euthymic  Affect:  Constricted  Thought Process:  Goal Directed  Orientation:  Full (Time, Place, and Person)  Thought Content:  Logical  Suicidal Thoughts:  No  Homicidal Thoughts:  No  Memory:  Immediate;   Fair Recent;   Fair Remote;   Fair  Judgement:  Fair  Insight:  Fair  Psychomotor Activity:  Decreased  Concentration:  Concentration: Fair  Recall:  [employer] of Knowledge:  Fair  Language:  Fair  Akathisia:  No  Handed:  Right  AIMS (if indicated):     Assets:  Desire for Improvement Housing Physical Health Resilience Social Support  ADL's:  Intact  Cognition:  WNL  Sleep:  Number of Hours: 5.5     Have you used any form of tobacco in the last 30 days? (Cigarettes, Smokeless Tobacco, Cigars, and/or Pipes): No  Has this patient used any form of tobacco in the last 30 days? (Cigarettes, Smokeless Tobacco, Cigars, and/or Pipes) Yes, No  Blood Alcohol level:  Lab Results  Component Value Date   ETH <10 11/09/2018    Metabolic Disorder Labs:  No results found for: HGBA1C, MPG No results found for: PROLACTIN No results found for: CHOL, TRIG, HDL, CHOLHDL, VLDL, LDLCALC  See Psychiatric Specialty Exam and Suicide Risk Assessment completed by Attending Physician prior to discharge.  Discharge destination:  Home  Is patient on multiple antipsychotic therapies at discharge:  Yes,   Do you recommend tapering to monotherapy for antipsychotics?  Yes   Has Patient had three or more failed trials of antipsychotic monotherapy by history:  No  Recommended Plan for Multiple Antipsychotic Therapies: Taper to monotherapy as described:  Gradually should be able to discontinue use of as needed Risperdal  Discharge Instructions    Diet - low sodium heart healthy   Complete by:  As directed    Increase  activity slowly   Complete by:  As directed      Allergies as of 11/30/2018      Reactions   Moxifloxacin Hives   Other reaction(s): HIVES      Medication List    STOP taking these medications   diltiazem 60 MG tablet Commonly known as:  CARDIZEM   fexofenadine 180 MG tablet Commonly known as:  ALLEGRA Replaced by:  loratadine 10 MG tablet   hydrOXYzine 25 MG tablet Commonly known as:  ATARAX/VISTARIL     TAKE these medications     Indication  ibuprofen 200 MG tablet Commonly known as:  ADVIL,MOTRIN Take 600 mg by mouth every 6 (six) hours as needed for pain.  Indication:  Mild to Moderate Pain   lisinopril 20 MG tablet Commonly known as:  PRINIVIL,ZESTRIL Take 1 tablet (20 mg total) by mouth daily. Start taking on:  December 01, 2018  Indication:  High Blood Pressure Disorder   loratadine 10 MG tablet Commonly known as:  CLARITIN Take 1 tablet (10 mg total) by mouth daily. Start taking on:  December 01, 2018 Replaces:  fexofenadine 180 MG tablet  Indication:  Perennial Allergic Rhinitis   OLANZapine 20 MG tablet Commonly known as:  ZyPREXA Take 1 tablet (20 mg total) by mouth at bedtime.  Indication:  Major Depressive Disorder   risperiDONE 1 MG disintegrating tablet Commonly known as:  RISPERDAL M-TABS Take 1 tablet (1 mg total) by mouth every 6 (six) hours as needed (anxiety).  Indication:  Major Depressive Disorder, Psychomotor Agitation   sertraline 100 MG tablet Commonly known as:  ZOLOFT Take 1 tablet (100 mg total) by mouth at bedtime. What changed:    medication strength  how much to take  when to take this  Indication:  Major Depressive Disorder   traZODone 50 MG tablet Commonly known as:  DESYREL Take 1 tablet (50 mg total) by mouth at bedtime. What changed:  how much to take  Indication:  Trouble Sleeping      Follow-up Information    [facility].   Why:  Zella BallRobin is a female faith based counselor in [place], [place] who  accepts your insurance. She is currently only doing telehealth sessions due to COVID-19. Please call for more information and schedule an appointment. Thank you! Contact information: [facility] [address]  [place], [place] [id number]  Phone: [phone]          Follow-up recommendations:  Activity:  Activity as tolerated Diet:  Regular diet Other:  [facility] and with primary care doctor and local physicians through telehealth.  Comments: Patient received psychoeducation.  Prescriptions are written.  Reviewed treatment plan with patient as well as diagnosis and expected course of recovery.  Signed: Mordecai Rasmussen, MD 11/30/2018, 6:24 PM

## 2018-11-30 NOTE — [facility] Suicide Risk Assessment (Signed)
[facility] Discharge Suicide Risk Assessment   Principal Problem: Brief reactive psychosis (HCC) Discharge Diagnoses: Principal Problem:   Brief reactive psychosis (HCC) Active Problems:   Depression   Anxiety state   Total Time spent with patient: 45 minutes  Musculoskeletal: Strength & Muscle Tone: within normal limits Gait & Station: normal Patient leans: N/A  Psychiatric Specialty Exam: Review of Systems  Constitutional: Negative.   HENT: Negative.   Eyes: Negative.   Respiratory: Negative.   Cardiovascular: Negative.   Gastrointestinal: Negative.   Musculoskeletal: Negative.   Skin: Negative.   Neurological: Negative.   Psychiatric/Behavioral: Positive for memory loss. Negative for depression, hallucinations, substance abuse and suicidal ideas. The patient is nervous/anxious. The patient does not have insomnia.     Blood pressure 126/64, pulse 84, temperature 98.5 F (36.9 C), temperature source Oral, resp. rate 16, height 5\' 1"  (1.549 m), weight 70.3 kg, last menstrual period 11/09/2018, SpO2 99 %.Body mass index is 29.29 kg/m.  General Appearance: Casual  Eye Contact::  Good  Speech:  Clear and Coherent409  Volume:  Normal  Mood:  Euthymic  Affect:  Constricted  Thought Process:  Coherent  Orientation:  Full (Time, Place, and Person)  Thought Content:  Logical  Suicidal Thoughts:  No  Homicidal Thoughts:  No  Memory:  Immediate;   Fair Recent;   Fair Remote;   Fair  Judgement:  Fair  Insight:  Fair  Psychomotor Activity:  Decreased  Concentration:  Fair  Recall:  [employer] of Knowledge:Good  Language: Good  Akathisia:  No  Handed:  Right  AIMS (if indicated):     Assets:  Communication Skills Desire for Improvement Financial Resources/Insurance Housing Physical Health Resilience Social Support  Sleep:  Number of Hours: 5.5  Cognition: Impaired,  Mild  ADL's:  Intact   Mental Status Per Nursing Assessment::   On Admission:  NA  Demographic  Factors:  NA  Loss Factors: No clear specific loss although the patient has been overwhelmed by stress at her job recently  Historical Factors: NA  Risk Reduction Factors:   Responsible for children under 76 years of age, Sense of responsibility to family, Religious beliefs about death, Living with another person, especially a relative, Positive social support and Positive therapeutic relationship  Continued Clinical Symptoms:  Depression:   Impulsivity  Cognitive Features That Contribute To Risk:  Loss of executive function    Suicide Risk:  Minimal: No identifiable suicidal ideation.  Patients presenting with no risk factors but with morbid ruminations; may be classified as minimal risk based on the severity of the depressive symptoms    Plan Of Care/Follow-up recommendations:  Activity:  Activity as tolerated Diet:  Regular diet Other:  Emphasized very strongly to the patient and husband the importance of medication compliance and outpatient follow-up and the importance of monitoring for any return of symptoms that could warrant a return visit to the emergency room.  Mordecai Rasmussen, MD 11/30/2018, 12:02 PM

## 2020-07-30 ENCOUNTER — Encounter: Payer: Self-pay | Admitting: Emergency Medicine

## 2020-07-30 ENCOUNTER — Ambulatory Visit
Admission: EM | Admit: 2020-07-30 | Discharge: 2020-07-30 | Disposition: A | Discharge status: Home / Self Care | Payer: 59

## 2020-07-30 DIAGNOSIS — R509 Fever, unspecified: Secondary | ICD-10-CM

## 2020-07-30 DIAGNOSIS — R059 Cough, unspecified: Secondary | ICD-10-CM

## 2020-07-30 DIAGNOSIS — J0110 Acute frontal sinusitis, unspecified: Secondary | ICD-10-CM

## 2020-07-30 MED ORDER — ALBUTEROL SULFATE HFA 108 (90 BASE) MCG/ACT IN AERS: 1.0000 | INHALATION_SPRAY | Freq: Four times a day (QID) | RESPIRATORY_TRACT | 0 refills | Status: AC | PRN

## 2020-07-30 MED ORDER — CEFDINIR 300 MG PO CAPS: 300.0000 mg | ORAL_CAPSULE | Freq: Two times a day (BID) | ORAL | 0 refills | Status: AC

## 2020-07-30 NOTE — ED Provider Notes (Signed)
Ashley Griffin    CSN: [id number] Arrival date & time: 07/30/20  7829      History   Chief Complaint Chief Complaint  Patient presents with  . URI    HPI Ashley Griffin is a 39 y.o. female.   Patient is a 39 year old female who presents today for congestion, cough, fever.  Symptoms have been constant worsening over the past week.  Started Friday.  Does have a history of allergies has been doing sinus rinses and using Flonase without any relief.  Reporting significant right-sided facial pressure when laying down and wheezing at nighttime.  Productive cough.  Kids have been sick recently. Temp 100.5 today.      History reviewed. No pertinent past medical history.  Patient Active Problem List   Diagnosis Date Noted  . Brief reactive psychosis (HCC) 11/10/2018  . Depression 11/10/2018  . Anxiety state 11/10/2018  . Psychosis, paranoid (HCC) 11/09/2018  . MDD (major depressive disorder), single episode, severe with psychosis (HCC) 11/09/2018    History reviewed. No pertinent surgical history.  OB History   No obstetric history on file.      Home Medications    Prior to Admission medications   Medication Sig Start Date End Date Taking? Authorizing Provider  buPROPion (WELLBUTRIN) 100 MG tablet Take 100 mg by mouth 2 (two) times daily.   Yes [provider]  albuterol (VENTOLIN HFA) 108 (90 Base) MCG/ACT inhaler Inhale 1-2 puffs into the lungs every 6 (six) hours as needed for wheezing or shortness of breath. 07/30/20   Dahlia Byes A, NP  cefdinir (OMNICEF) 300 MG capsule Take 1 capsule (300 mg total) by mouth 2 (two) times daily for 10 days. 07/30/20 08/09/20  Dahlia Byes A, NP  ibuprofen (ADVIL,MOTRIN) 200 MG tablet Take 600 mg by mouth every 6 (six) hours as needed for pain. 05/04/17   [provider]  lisinopril (PRINIVIL,ZESTRIL) 20 MG tablet Take 1 tablet (20 mg total) by mouth daily. 12/01/18   Clapacs, Jackquline Denmark, MD  loratadine (CLARITIN) 10 MG  tablet Take 1 tablet (10 mg total) by mouth daily. 12/01/18   Clapacs, Jackquline Denmark, MD  OLANZapine (ZYPREXA) 20 MG tablet Take 1 tablet (20 mg total) by mouth at bedtime. 11/30/18 01/29/19  Clapacs, Jackquline Denmark, MD  risperiDONE (RISPERDAL M-TABS) 1 MG disintegrating tablet Take 1 tablet (1 mg total) by mouth every 6 (six) hours as needed (anxiety). 11/30/18   Clapacs, Jackquline Denmark, MD  risperiDONE (RISPERDAL) 1 MG tablet Take 1 tablet (1 mg total) by mouth every 8 (eight) hours as needed (anxiety). 11/30/18   Clapacs, Jackquline Denmark, MD  sertraline (ZOLOFT) 100 MG tablet Take 1 tablet (100 mg total) by mouth at bedtime. 11/30/18   Clapacs, Jackquline Denmark, MD  traZODone (DESYREL) 50 MG tablet Take 1 tablet (50 mg total) by mouth at bedtime. 11/30/18   Clapacs, Jackquline Denmark, MD    Family History No family history on file.  Social History Social History   Tobacco Use  . Smoking status: Never Smoker  . Smokeless tobacco: Never Used  Substance Use Topics  . Alcohol use: Not Currently  . Drug use: Not on file     Allergies   Moxifloxacin   Review of Systems Review of Systems   Physical Exam Triage Vital Signs ED Triage Vitals  Enc Vitals Group     BP 07/30/20 0918 131/89     Pulse Rate 07/30/20 0918 80     Resp 07/30/20 0918 16  Temp 07/30/20 0918 (!) 100.5 F (38.1 C)     Temp Source 07/30/20 0918 Oral     SpO2 07/30/20 0918 98 %     Weight 07/30/20 0919 165 lb (74.8 kg)     Height --      Head Circumference --      Peak Flow --      Pain Score 07/30/20 0918 0     Pain Loc --      Pain Edu? --      Excl. in GC? --    No data found.  Updated Vital Signs BP 131/89   Pulse 80   Temp (!) 100.5 F (38.1 C) (Oral)   Resp 16   Wt 165 lb (74.8 kg)   LMP 07/01/2020   SpO2 98%   BMI 31.18 kg/m   Visual Acuity Right Eye Distance:   Left Eye Distance:   Bilateral Distance:    Right Eye Near:   Left Eye Near:    Bilateral Near:     Physical Exam Vitals and nursing note reviewed.  Constitutional:       General: She is not in acute distress.    Appearance: Normal appearance. She is not ill-appearing, toxic-appearing or diaphoretic.  HENT:     Head: Normocephalic.     Ears:     Comments: TMs opaque.     Nose: Congestion present.     Comments: Frontal and right maxillary pressure.     Mouth/Throat:     Pharynx: Oropharynx is clear.  Eyes:     Conjunctiva/sclera: Conjunctivae normal.  Cardiovascular:     Rate and Rhythm: Normal rate and regular rhythm.  Pulmonary:     Effort: Pulmonary effort is normal.     Breath sounds: Rales present.     Comments: Rales left lower lung.  Musculoskeletal:        General: Normal range of motion.     Cervical back: Normal range of motion.  Skin:    General: Skin is warm and dry.     Findings: No rash.  Neurological:     Mental Status: She is alert.  Psychiatric:        Mood and Affect: Mood normal.      UC Treatments / Results  Labs (all labs ordered are listed, but only abnormal results are displayed) Labs Reviewed  NOVEL CORONAVIRUS, NAA    EKG   Radiology No results found.  Procedures Procedures (including critical care time)  Medications Ordered in UC Medications - No data to display  Initial Impression / Assessment and Plan / UC Course  I have reviewed the triage vital signs and the nursing notes.  Pertinent labs & imaging results that were available during my care of the patient were reviewed by me and considered in my medical decision making (see chart for details).     Fever, cough, sinusitis, possible pneumonia.  Treating for possible sinus infection versus pneumonia.  Medication as prescribed.  Treating with cefdinir Inhaler as needed for cough, wheezing or shortness of breath.  Mucinex for cough and chest congestion. Follow up as needed for continued or worsening symptoms  Final Clinical Impressions(s) / UC Diagnoses   Final diagnoses:  Fever, unspecified  Cough  Acute non-recurrent frontal sinusitis      Discharge Instructions     Treating for a possible sinus infection versus pneumonia.  Medication as prescribed. Inhaler as needed and recommend Mucinex Follow up as needed for continued or worsening symptoms  ED Prescriptions    Medication Sig Dispense Auth. Provider   cefdinir (OMNICEF) 300 MG capsule Take 1 capsule (300 mg total) by mouth 2 (two) times daily for 10 days. 20 capsule Meyah Corle A, NP   albuterol (VENTOLIN HFA) 108 (90 Base) MCG/ACT inhaler Inhale 1-2 puffs into the lungs every 6 (six) hours as needed for wheezing or shortness of breath. 1 each Janace Aris, NP     PDMP not reviewed this encounter.   Dahlia Byes A, NP 07/30/20 1001

## 2020-07-30 NOTE — Discharge Instructions (Addendum)
Treating for a possible sinus infection versus pneumonia.  Medication as prescribed. Inhaler as needed and recommend Mucinex Follow up as needed for continued or worsening symptoms

## 2020-07-30 NOTE — ED Triage Notes (Signed)
Congestion for about 1 week, cough that started Friday.  Pt has allergies.  Has been doing sinus rinses with no relief. No fevers.

## 2020-08-01 LAB — SARS-COV-2, NAA 2 DAY TAT

## 2020-08-01 LAB — NOVEL CORONAVIRUS, NAA: SARS-CoV-2, NAA: NOT DETECTED

## 2020-08-12 IMAGING — CT CT HEAD WITHOUT CONTRAST
3 series · 16 of 46 positions shown, 19 images · non-contrast
Comparison: None.

CLINICAL DATA: Altered mental status.  Unable to eat.

EXAM:
CT HEAD WITHOUT CONTRAST
TECHNIQUE: Contiguous axial images were obtained from the base of the skull
through the vertex without intravenous contrast.

[Series 2: head wo · axial · 0.40mm/px · z∈[-120,+0]mm · 10 of 29 slices shown, 13 images]
[im 3/29  brain]
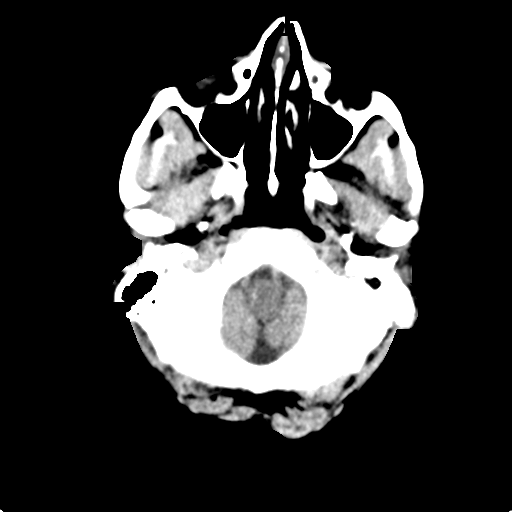
[im 3/29  bone]
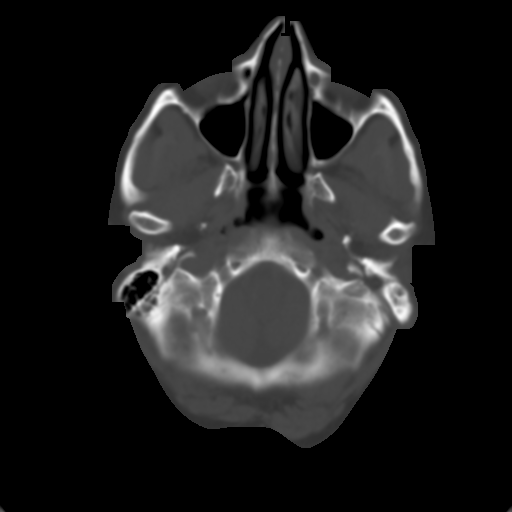
[im 6/29  brain]
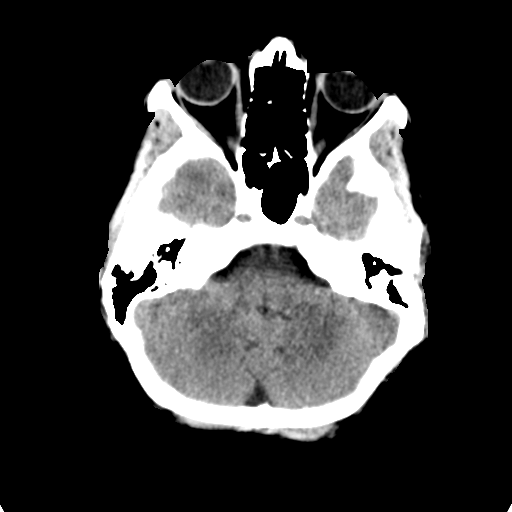
[im 8/29  brain]
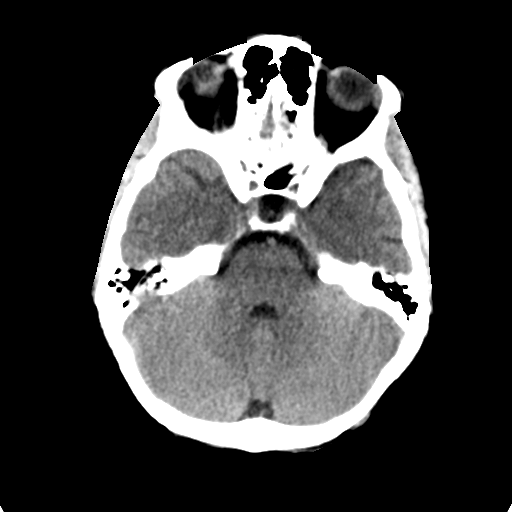
[im 11/29  brain]
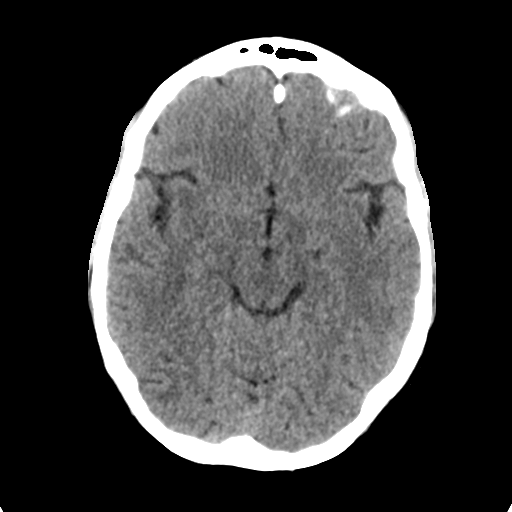
[im 14/29  brain]
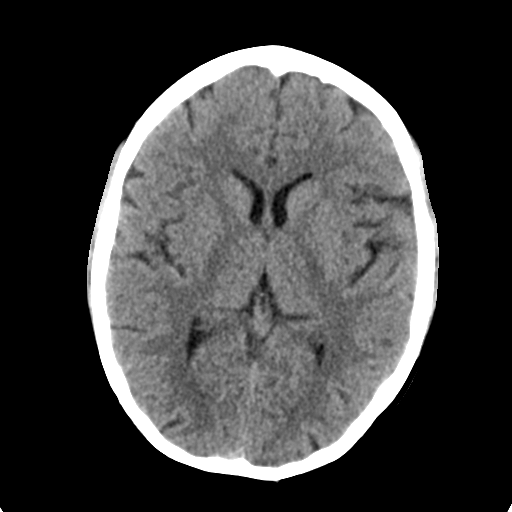
[im 14/29  bone]
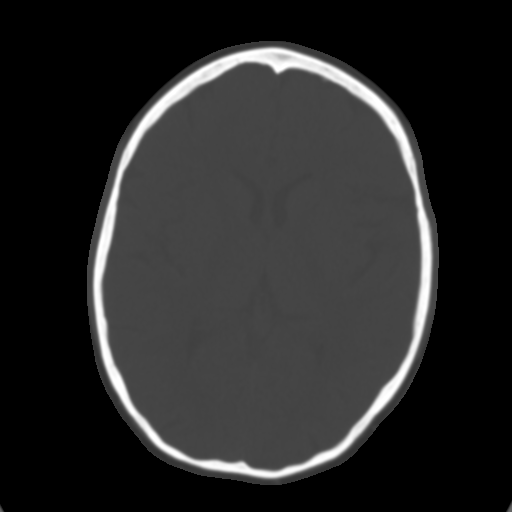
[im 16/29  brain]
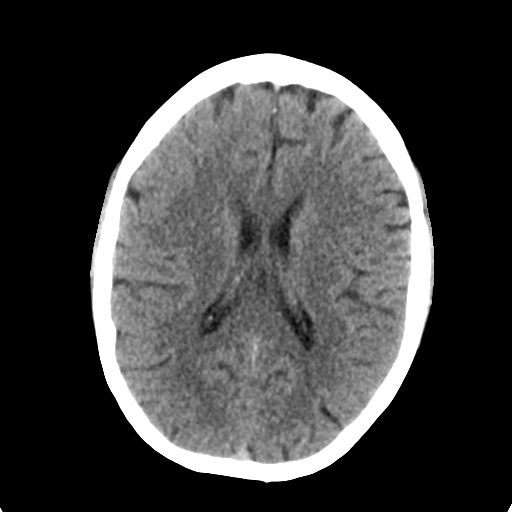
[im 19/29  brain]
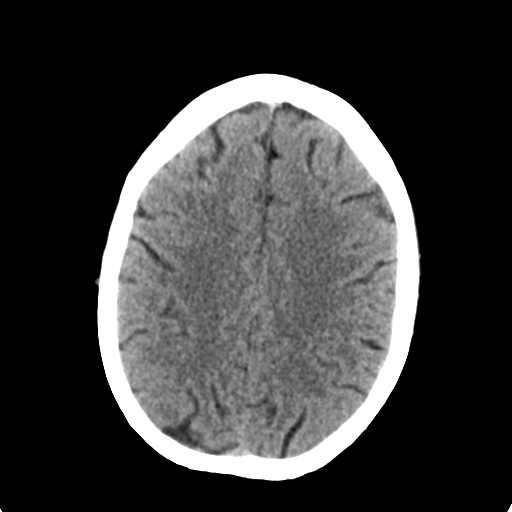
[im 22/29  brain]
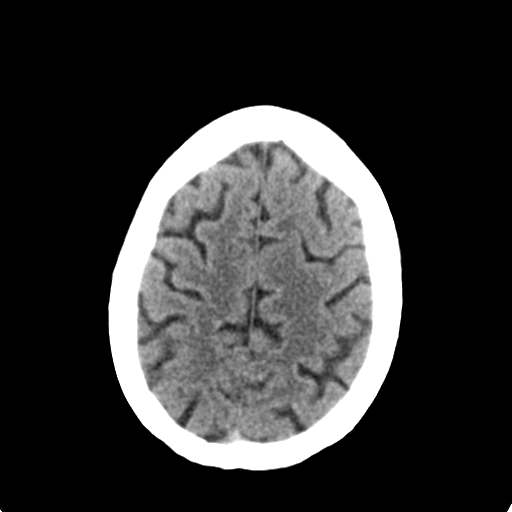
[im 24/29  brain]
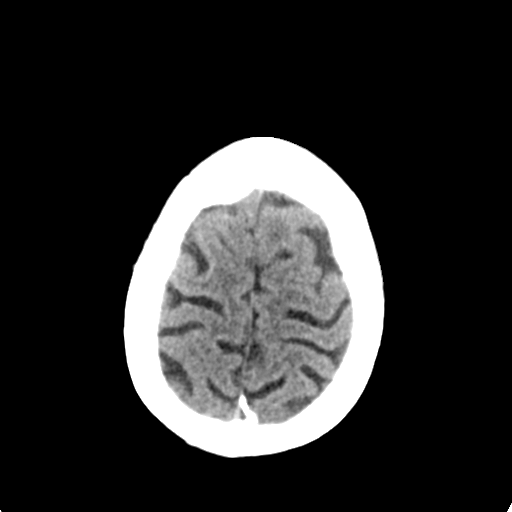
[im 24/29  bone]
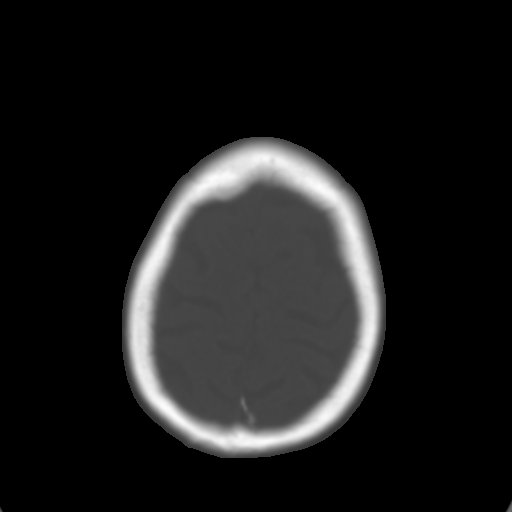
[im 27/29  brain]
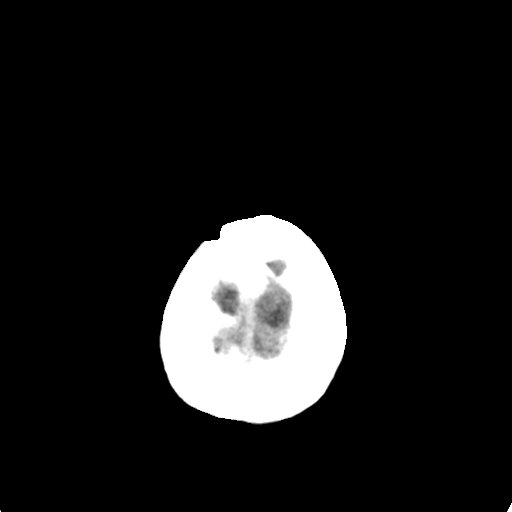

[Series 4: coronal soft tissue · coronal · 0.29mm/px · 3 of 63 slices shown]
[im 21/63  brain]
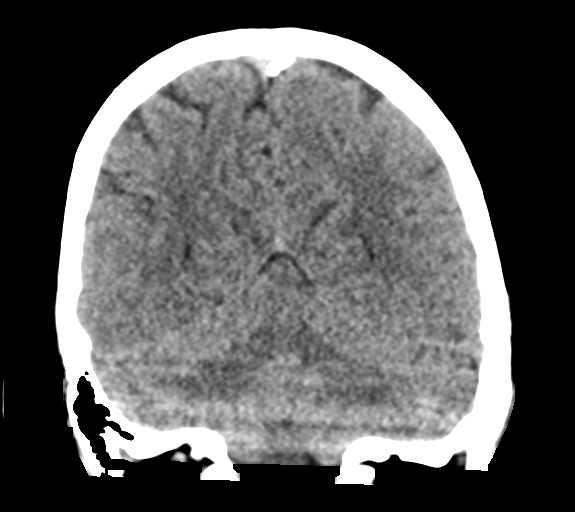
[im 28/63  brain]
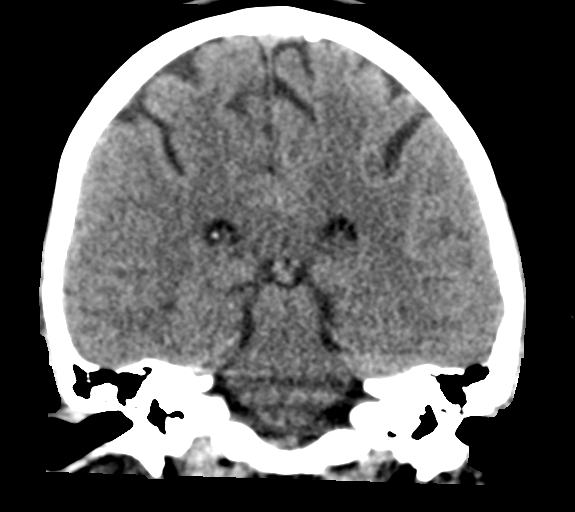
[im 35/63  brain]
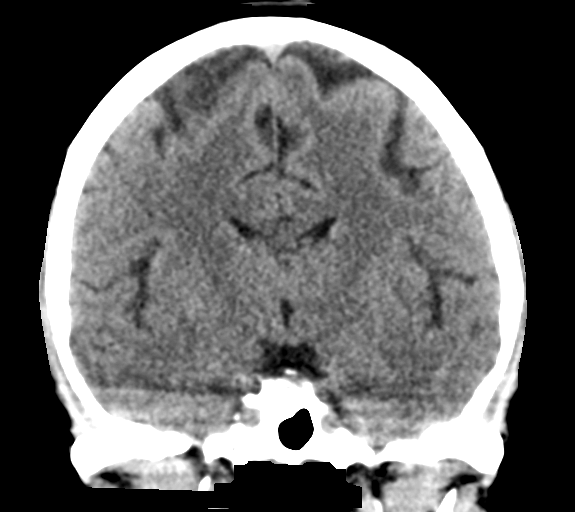

[Series 5: sagittal soft tissue · sagittal · 0.29mm/px · 3 of 52 slices shown]
[im 18/52  brain]
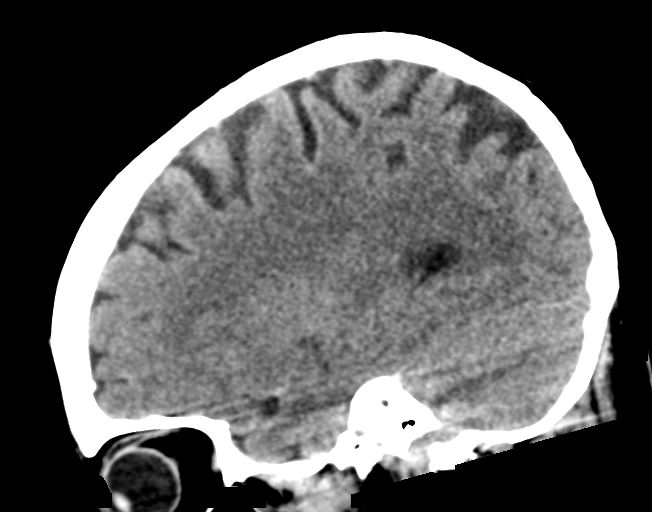
[im 26/52  brain]
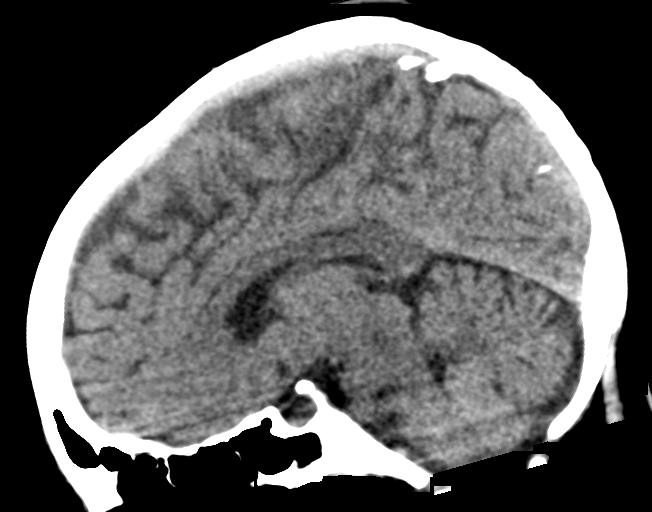
[im 35/52  brain]
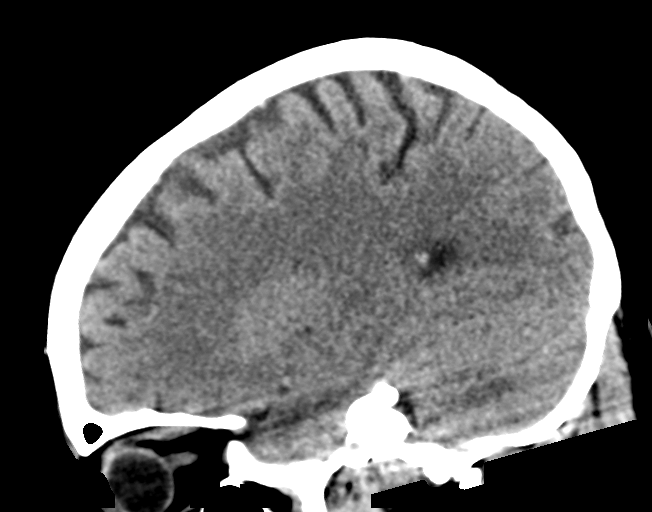

[16 of 46 positions shown; findings below may reference images not displayed]

FINDINGS: Brain: The brain shows a normal appearance without evidence of
malformation, atrophy, old or acute small or large vessel
infarction, mass lesion, hemorrhage, hydrocephalus or extra-axial
collection.

Vascular: No hyperdense vessel. No evidence of atherosclerotic
calcification.

Skull: Normal.  No traumatic finding.  No focal bone lesion.

Sinuses/Orbits: Sinuses are clear. Orbits appear normal. Mastoids
are clear.

Other: None significant
IMPRESSION: Normal head CT.

## 2023-06-03 ENCOUNTER — Ambulatory Visit
Admission: EM | Admit: 2023-06-03 | Discharge: 2023-06-03 | Disposition: A | Discharge status: Home / Self Care | Payer: 59 | Attending: Physician Assistant | Admitting: Physician Assistant

## 2023-06-03 ENCOUNTER — Encounter: Payer: Self-pay | Admitting: Emergency Medicine

## 2023-06-03 DIAGNOSIS — J069 Acute upper respiratory infection, unspecified: Secondary | ICD-10-CM | POA: Diagnosis not present

## 2023-06-03 LAB — GROUP A STREP BY PCR: Group A Strep by PCR: NOT DETECTED

## 2023-06-03 MED ORDER — AZITHROMYCIN 250 MG PO TABS: 250.0000 mg | ORAL_TABLET | Freq: Every day | ORAL | 0 refills | Status: AC

## 2023-06-03 NOTE — ED Provider Notes (Signed)
[facility]    CSN: [id number] Arrival date & time: 06/03/23  1801      History   Chief Complaint Chief Complaint  Patient presents with   Sore Throat   Headache    HPI Ashley Griffin is a 42 y.o. female.   Patient presents for evaluation of 3 to 4 days of nasal congestion, rhinorrhea, began to experience sore throat 1 day ago, worsening overnight with possible fever.  She woke up sweating.  Known sick contacts in household.  Tolerating food and liquids.  Has attempted use of ibuprofen which has been helpful but when medication no longer present pain intensifies.  Has been experience intermittent bilateral ear pain which has improved to more so with fullness, left side worse than right.  Denies presence of cough.    History reviewed. No pertinent past medical history.  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 No pertinent surgical history.  OB History   No obstetric history on file.      Home Medications    Prior to Admission medications   Medication Sig Start Date End Date Taking? Authorizing Provider  FLUoxetine (PROZAC) 10 MG capsule Take 10 mg by mouth daily. 05/09/23  Yes [provider]  FLUoxetine (PROZAC) 20 MG capsule Take 20 mg by mouth daily. 12/01/20  Yes [provider]  risperiDONE (RISPERDAL) 1 MG tablet Take 1 tablet (1 mg total) by mouth every 8 (eight) hours as needed (anxiety). 11/30/18  Yes Clapacs, Jackquline Denmark, MD  albuterol (VENTOLIN HFA) 108 (90 Base) MCG/ACT inhaler Inhale 1-2 puffs into the lungs every 6 (six) hours as needed for wheezing or shortness of breath. 07/30/20   Dahlia Byes A, NP  buPROPion (WELLBUTRIN) 100 MG tablet Take 100 mg by mouth 2 (two) times daily.     [provider]  ibuprofen (ADVIL,MOTRIN) 200 MG tablet Take 600 mg by mouth every 6 (six) hours as needed for pain. 05/04/17   [provider]  lisinopril (PRINIVIL,ZESTRIL) 20 MG tablet Take 1 tablet (20 mg total) by mouth daily. 12/01/18   Clapacs, Jackquline Denmark, MD  loratadine (CLARITIN) 10 MG tablet Take 1 tablet (10 mg total) by mouth daily. 12/01/18   Clapacs, Jackquline Denmark, MD  OLANZapine (ZYPREXA) 20 MG tablet Take 1 tablet (20 mg total) by mouth at bedtime. 11/30/18 01/29/19  Clapacs, Jackquline Denmark, MD  risperiDONE (RISPERDAL M-TABS) 1 MG disintegrating tablet Take 1 tablet (1 mg total) by mouth every 6 (six) hours as needed (anxiety). 11/30/18   Clapacs, Jackquline Denmark, MD  sertraline (ZOLOFT) 100 MG tablet Take 1 tablet (100 mg total) by mouth at bedtime. 11/30/18   Clapacs, Jackquline Denmark, MD  traZODone (DESYREL) 50 MG tablet Take 1 tablet (50 mg total) by mouth at bedtime. 11/30/18   Clapacs, Jackquline Denmark, MD    Family History History reviewed. No pertinent family history.  Social History Social History   Tobacco Use   Smoking status: Never   Smokeless tobacco: Never  Vaping Use   Vaping status: Never Used  Substance Use Topics   Alcohol use: Not Currently   Drug use: Never     Allergies   Moxifloxacin   Review of Systems Review of Systems   Physical Exam Triage  Vital Signs ED Triage Vitals  Encounter Vitals Group     BP 06/03/23 1831 (!) 129/90     Systolic BP Percentile --      Diastolic BP Percentile --      Pulse Rate 06/03/23 1831 69     Resp 06/03/23 1831 14     Temp 06/03/23 1831 98.4 F (36.9 C)     Temp Source 06/03/23 1831 Oral     SpO2 06/03/23 1831 98 %     Weight 06/03/23 1827 175 lb (79.4 kg)     Height 06/03/23 1827 5\' 1"  (1.549 m)     Head Circumference --      Peak Flow --      Pain Score 06/03/23 1826 8     Pain Loc --      Pain Education --      Exclude from Growth Chart --    No data found.  Updated Vital Signs BP (!) 129/90 (BP Location: Right Arm)   Pulse 69    Temp 98.4 F (36.9 C) (Oral)   Resp 14   Ht 5\' 1"  (1.549 m)   Wt 175 lb (79.4 kg)   LMP 05/27/2023 (Approximate)   SpO2 98%   BMI 33.07 kg/m   Visual Acuity Right Eye Distance:   Left Eye Distance:   Bilateral Distance:    Right Eye Near:   Left Eye Near:    Bilateral Near:     Physical Exam Constitutional:      Appearance: Normal appearance.  HENT:     Head: Normocephalic.     Right Ear: Tympanic membrane, ear canal and external ear normal.     Left Ear: Tympanic membrane, ear canal and external ear normal.     Nose: Congestion and rhinorrhea present.     Mouth/Throat:     Pharynx: No oropharyngeal exudate or posterior oropharyngeal erythema.  Cardiovascular:     Rate and Rhythm: Normal rate and regular rhythm.     Pulses: Normal pulses.     Heart sounds: Normal heart sounds.  Pulmonary:     Effort: Pulmonary effort is normal.     Breath sounds: Normal breath sounds.  Musculoskeletal:     Cervical back: Normal range of motion and neck supple.  Skin:    General: Skin is warm and dry.  Neurological:     Mental Status: She is alert and oriented to person, place, and time. Mental status is at baseline.      UC Treatments / Results  Labs (all labs ordered are listed, but only abnormal results are displayed) Labs Reviewed  GROUP A STREP BY PCR    EKG   Radiology No results found.  Procedures Procedures (including critical care time)  Medications Ordered in UC Medications - No data to display  Initial Impression / Assessment and Plan / UC Course  I have reviewed the triage vital signs and the nursing notes.  Pertinent labs & imaging results that were available during my care of the patient were reviewed by me and considered in my medical decision making (see chart for details).  Viral URI  Patient is in no signs of distress nor toxic appearing.  Vital signs are stable.  Low suspicion for pneumonia, pneumothorax or bronchitis and therefore will  defer imaging.  Strep PCR negative.  Etiology most sick contact is within household.  Blood for antibiotic placed at pharmacy for day 10 of illness, azithromycin sent.May use additional over-the-counter medications as needed for  supportive care.  May follow-up with urgent care as needed if symptoms persist or worsen.   Final Clinical Impressions(s) / UC Diagnoses   Final diagnoses:  None   Discharge Instructions   None    ED Prescriptions   None    PDMP not reviewed this encounter.   Valinda Hoar, NP 06/03/23 1911

## 2023-06-03 NOTE — ED Triage Notes (Signed)
Patient c/o sore throat, headache, and nasal congestion that started yesterday.  Patient denies fevers.

## 2023-06-03 NOTE — Discharge Instructions (Signed)
Your symptoms today are most likely being caused by a virus and should steadily improve in time it can take up to 7 to 10 days before you truly start to see a turnaround however things will get better, if no improvement seen by Tuesday, June 07, 2023 you may begin azithromycin as directed to provide coverage for bacteria  Strep PCR testing negative for bacteria to the throat    You can take Tylenol and/or Ibuprofen as needed for fever reduction and pain relief.   For cough: honey 1/2 to 1 teaspoon (you can dilute the honey in water or another fluid).  You can also use guaifenesin and dextromethorphan for cough. You can use a humidifier for chest congestion and cough.  If you don't have a humidifier, you can sit in the bathroom with the hot shower running.      For sore throat: try warm salt water gargles, cepacol lozenges, throat spray, warm tea or water with lemon/honey, popsicles or ice, or OTC cold relief medicine for throat discomfort.   For congestion: take a daily anti-histamine like Zyrtec, Claritin, and a oral decongestant, such as pseudoephedrine.  You can also use Flonase 1-2 sprays in each nostril daily.   It is important to stay hydrated: drink plenty of fluids (water, gatorade/powerade/pedialyte, juices, or teas) to keep your throat moisturized and help further relieve irritation/discomfort.
